# Patient Record
Sex: Female | Born: 1975 | Race: White | Hispanic: No | Marital: Single | Smoking: Current some day smoker
Health system: Southern US, Community
[De-identification: ages and names within clinical notes are randomized; demographics above are authoritative.]

## PROBLEM LIST (undated history)

## (undated) DIAGNOSIS — C539 Malignant neoplasm of cervix uteri, unspecified: Secondary | ICD-10-CM

## (undated) DIAGNOSIS — R609 Edema, unspecified: Secondary | ICD-10-CM

## (undated) DIAGNOSIS — M069 Rheumatoid arthritis, unspecified: Secondary | ICD-10-CM

## (undated) DIAGNOSIS — K5090 Crohn's disease, unspecified, without complications: Secondary | ICD-10-CM

## (undated) DIAGNOSIS — F4310 Post-traumatic stress disorder, unspecified: Secondary | ICD-10-CM

## (undated) DIAGNOSIS — G35D Multiple sclerosis, unspecified: Secondary | ICD-10-CM

## (undated) DIAGNOSIS — K529 Noninfective gastroenteritis and colitis, unspecified: Secondary | ICD-10-CM

## (undated) DIAGNOSIS — D649 Anemia, unspecified: Secondary | ICD-10-CM

## (undated) DIAGNOSIS — J9819 Other pulmonary collapse: Secondary | ICD-10-CM

## (undated) DIAGNOSIS — M1990 Unspecified osteoarthritis, unspecified site: Secondary | ICD-10-CM

## (undated) DIAGNOSIS — G35 Multiple sclerosis: Secondary | ICD-10-CM

## (undated) HISTORY — PX: ABDOMINAL SURGERY: SHX537

## (undated) HISTORY — DX: Multiple sclerosis: G35

## (undated) HISTORY — DX: Post-traumatic stress disorder, unspecified: F43.10

## (undated) HISTORY — DX: Multiple sclerosis, unspecified: G35.D

## (undated) HISTORY — PX: TUBAL LIGATION: SHX77

---

## 1997-12-19 ENCOUNTER — Inpatient Hospital Stay (HOSPITAL_COMMUNITY): Admission: EM | Admit: 1997-12-19 | Discharge: 1997-12-19 | Payer: Self-pay | Admitting: Obstetrics & Gynecology

## 1997-12-29 ENCOUNTER — Other Ambulatory Visit: Admission: RE | Admit: 1997-12-29 | Discharge: 1997-12-29 | Payer: Self-pay | Admitting: Obstetrics

## 1998-02-21 ENCOUNTER — Inpatient Hospital Stay (HOSPITAL_COMMUNITY): Admission: AD | Admit: 1998-02-21 | Discharge: 1998-02-21 | Payer: Self-pay | Admitting: Obstetrics

## 1998-03-29 ENCOUNTER — Ambulatory Visit (HOSPITAL_COMMUNITY): Admission: RE | Admit: 1998-03-29 | Discharge: 1998-03-29 | Payer: Self-pay | Admitting: Obstetrics

## 1998-04-25 ENCOUNTER — Inpatient Hospital Stay (HOSPITAL_COMMUNITY): Admission: AD | Admit: 1998-04-25 | Discharge: 1998-04-25 | Payer: Self-pay | Admitting: Obstetrics

## 1998-05-10 ENCOUNTER — Ambulatory Visit (HOSPITAL_COMMUNITY): Admission: RE | Admit: 1998-05-10 | Discharge: 1998-05-10 | Payer: Self-pay | Admitting: Obstetrics

## 1998-05-11 ENCOUNTER — Inpatient Hospital Stay (HOSPITAL_COMMUNITY): Admission: AD | Admit: 1998-05-11 | Discharge: 1998-05-11 | Payer: Self-pay | Admitting: Obstetrics

## 1998-06-17 ENCOUNTER — Inpatient Hospital Stay (HOSPITAL_COMMUNITY): Admission: AD | Admit: 1998-06-17 | Discharge: 1998-06-17 | Payer: Self-pay | Admitting: Obstetrics

## 1998-07-19 ENCOUNTER — Other Ambulatory Visit: Admission: RE | Admit: 1998-07-19 | Discharge: 1998-07-19 | Payer: Self-pay | Admitting: Obstetrics

## 1998-08-08 ENCOUNTER — Inpatient Hospital Stay (HOSPITAL_COMMUNITY): Admission: AD | Admit: 1998-08-08 | Discharge: 1998-08-08 | Payer: Self-pay | Admitting: Obstetrics

## 1998-08-22 ENCOUNTER — Observation Stay (HOSPITAL_COMMUNITY): Admission: AD | Admit: 1998-08-22 | Discharge: 1998-08-22 | Payer: Self-pay | Admitting: Obstetrics

## 1998-08-22 ENCOUNTER — Inpatient Hospital Stay (HOSPITAL_COMMUNITY): Admission: AD | Admit: 1998-08-22 | Discharge: 1998-08-24 | Payer: Self-pay | Admitting: Obstetrics

## 1999-01-25 ENCOUNTER — Other Ambulatory Visit: Admission: RE | Admit: 1999-01-25 | Discharge: 1999-01-25 | Payer: Self-pay | Admitting: Obstetrics

## 2000-10-28 ENCOUNTER — Emergency Department (HOSPITAL_COMMUNITY): Admission: EM | Admit: 2000-10-28 | Discharge: 2000-10-28 | Payer: Self-pay | Admitting: Internal Medicine

## 2001-10-28 ENCOUNTER — Inpatient Hospital Stay (HOSPITAL_COMMUNITY): Admission: AD | Admit: 2001-10-28 | Discharge: 2001-10-28 | Payer: Self-pay | Admitting: *Deleted

## 2002-02-04 ENCOUNTER — Encounter: Payer: Self-pay | Admitting: Emergency Medicine

## 2002-02-04 ENCOUNTER — Emergency Department (HOSPITAL_COMMUNITY): Admission: EM | Admit: 2002-02-04 | Discharge: 2002-02-04 | Payer: Self-pay | Admitting: Emergency Medicine

## 2003-11-14 ENCOUNTER — Emergency Department (HOSPITAL_COMMUNITY): Admission: EM | Admit: 2003-11-14 | Discharge: 2003-11-14 | Payer: Self-pay | Admitting: Emergency Medicine

## 2004-08-22 ENCOUNTER — Emergency Department (HOSPITAL_COMMUNITY): Admission: EM | Admit: 2004-08-22 | Discharge: 2004-08-22 | Payer: Self-pay | Admitting: Emergency Medicine

## 2004-09-11 ENCOUNTER — Ambulatory Visit: Payer: Self-pay | Admitting: Gastroenterology

## 2004-09-11 ENCOUNTER — Ambulatory Visit: Payer: Self-pay | Admitting: Infectious Diseases

## 2004-09-11 ENCOUNTER — Inpatient Hospital Stay (HOSPITAL_COMMUNITY): Admission: EM | Admit: 2004-09-11 | Discharge: 2004-09-15 | Payer: Self-pay | Admitting: Emergency Medicine

## 2004-09-13 ENCOUNTER — Encounter (INDEPENDENT_AMBULATORY_CARE_PROVIDER_SITE_OTHER): Payer: Self-pay | Admitting: Specialist

## 2004-11-10 ENCOUNTER — Ambulatory Visit: Payer: Self-pay | Admitting: Gastroenterology

## 2004-11-15 ENCOUNTER — Ambulatory Visit: Payer: Self-pay | Admitting: Gastroenterology

## 2004-12-29 ENCOUNTER — Encounter (INDEPENDENT_AMBULATORY_CARE_PROVIDER_SITE_OTHER): Payer: Self-pay | Admitting: *Deleted

## 2004-12-29 ENCOUNTER — Inpatient Hospital Stay (HOSPITAL_COMMUNITY): Admission: EM | Admit: 2004-12-29 | Discharge: 2005-01-02 | Payer: Self-pay | Admitting: Emergency Medicine

## 2004-12-31 ENCOUNTER — Ambulatory Visit: Payer: Self-pay | Admitting: Internal Medicine

## 2005-07-15 ENCOUNTER — Emergency Department (HOSPITAL_COMMUNITY): Admission: EM | Admit: 2005-07-15 | Discharge: 2005-07-15 | Payer: Self-pay | Admitting: Emergency Medicine

## 2005-09-19 ENCOUNTER — Emergency Department (HOSPITAL_COMMUNITY): Admission: EM | Admit: 2005-09-19 | Discharge: 2005-09-19 | Payer: Self-pay | Admitting: Emergency Medicine

## 2005-12-03 ENCOUNTER — Emergency Department (HOSPITAL_COMMUNITY): Admission: EM | Admit: 2005-12-03 | Discharge: 2005-12-03 | Payer: Self-pay | Admitting: Emergency Medicine

## 2005-12-07 ENCOUNTER — Ambulatory Visit: Payer: Self-pay | Admitting: Gastroenterology

## 2005-12-11 ENCOUNTER — Ambulatory Visit (HOSPITAL_COMMUNITY): Admission: RE | Admit: 2005-12-11 | Discharge: 2005-12-11 | Payer: Self-pay | Admitting: Obstetrics & Gynecology

## 2005-12-12 ENCOUNTER — Ambulatory Visit: Payer: Self-pay | Admitting: Gastroenterology

## 2006-01-30 ENCOUNTER — Emergency Department (HOSPITAL_COMMUNITY): Admission: EM | Admit: 2006-01-30 | Discharge: 2006-01-30 | Payer: Self-pay | Admitting: Emergency Medicine

## 2006-07-16 ENCOUNTER — Emergency Department (HOSPITAL_COMMUNITY): Admission: EM | Admit: 2006-07-16 | Discharge: 2006-07-16 | Payer: Self-pay | Admitting: Emergency Medicine

## 2007-08-13 ENCOUNTER — Emergency Department (HOSPITAL_COMMUNITY): Admission: EM | Admit: 2007-08-13 | Discharge: 2007-08-13 | Payer: Self-pay | Admitting: Emergency Medicine

## 2011-01-06 NOTE — Discharge Summary (Signed)
[name], Gloria Ramos                 ACCOUNT NO.:  [id number]   MEDICAL RECORD NO.:  [id number]           PATIENT TYPE:   LOCATION:  5730                         FACILITY:  [facility]   PHYSICIAN:  Wilhemina Bonito. Marina Goodell, M.D. [facility] OF BIRTH:  04/20/1976   DATE OF ADMISSION:  12/29/2004  DATE OF DISCHARGE:  01/02/2005                                 DISCHARGE SUMMARY   ADDENDUM   The surgical biopsy from the colon has returned and this reveals no active  colitis or dysplasia on random biopsies.      SG/MEDQ  D:  01/02/2005  T:  01/02/2005  Job:  [id number]   cc:   Barbette Hair. Arlyce Dice, M.D. [facility]

## 2011-01-06 NOTE — Discharge Summary (Signed)
[name], [name]                 ACCOUNT NO.:  [id number]   MEDICAL RECORD NO.:  [id number]          PATIENT TYPE:  INP   LOCATION:  5738                         FACILITY:  [facility]   PHYSICIAN:  Donald Pore, MD       DATE OF BIRTH:  Apr 18, 1976   DATE OF ADMISSION:  09/11/2004  DATE OF DISCHARGE:  09/15/2004                                 DISCHARGE SUMMARY   DISCHARGE DIAGNOSES:  1.  Bloody diarrhea, consistent with probable infectious colitis.  2.  Headache with history of migraine.  3.  Recurrent urinary tract infections.   DISCHARGE MEDICATIONS:  Metronidazole 500 mg by mouth 4 times daily x7 days.   PROCEDURE PERFORMED:  Flexible sigmoidoscopy done September 13, 2004.  Findings:  Mild left-sided inflammation changes secondary to possible  infectious colitis, post-infectious colitis, or idiopathic colitis.   CONSULTATIONS:  [name] was evaluated by Dr. Arlyce Dice of [facility], who  performed a flexible sigmoidoscopy.   HISTORY AND PHYSICAL:  Ms. How is a 35 year old previously healthy female  who presented with an approximately four-day history of diarrhea every 30  minutes.  Initial quality of the stool at the beginning of the illness was  watery and greenish; however, over the last day or so, she began to notice  bloody streaks and developed worsening abdominal pain, nausea and vomiting,  and anorexia.  She noted positive sick contacts, including her children and  her husband, who have all have a similar illness within the past week or  two.  The symptoms of her family members resolved within 24-48 hours, and  there was no bloody diarrhea noted in any of them.  [name] denies fever,  chest pain, shortness of breath, syncope upon presentation.  She did  complain of pain in her left lower quadrant.   PHYSICAL EXAMINATION:  VITAL SIGNS:  Temperature 98.3, pulse 118, blood  pressure 140/93, respirations 24.  O2 saturation 97% on room air.  GENERAL:  Patient was in moderate  distress in the fetal position lying on  the gurney with a vomit basin in her hand.  HEENT:  Extraocular movements are intact.  Pupils are equal, round and  reactive to light.  Oropharynx without lesions.  Tongue ring present.  Dry  mucous membranes.  NECK:  No lymphadenopathy.  RESPIRATORY:  Clear to auscultation bilaterally.  CARDIOVASCULAR:  Tachycardic with a regular rhythm.  Distal pulses 1+ and  equal bilaterally.  GI:  Soft.  Tender to palpation diffusely, worse in the left lower quadrant.  No rebound or guarding noted.  No masses appreciated.  Hyperactive bowel  sounds.  EXTREMITIES:  No edema of the lower extremities bilaterally.  Weak distal  pulses.  GU:  Deferred, per patient's request.  SKIN:  Warm and dry.  MUSCULOSKELETAL:  Moves all four extremities.  NEUROLOGIC:  Cranial nerves II-XII are intact bilaterally.  PSYCHIATRIC:  Agitated affect.   LABORATORY STUDIES:  Sodium 137, potassium 3.6, chloride 103, bicarbonate  29, BUN 15, creatinine 1, glucose 97.  Urine pregnancy test negative.  Bilirubin 0.5, alkaline phosphatase 72, AST 19,  ALT 13, protein 6.8, albumin  3.7, calcium 8.6, lipase 21.  White blood cell count 15.2, hemoglobin 13.7,  hematocrit 40.6, platelets 189, absolute neutrophil count 12.3, MCV 86.7.  Urinalysis is positive for protein 100 mg/dl, negative nitrites, negative  leukocyte esterase, pH greater than 9.   HOSPITAL COURSE:  1.  Bloody diarrhea:  Ms. Swarm presented with an approximately four-day      history of bloody diarrhea and abdominal pain as well as numerous sick      contacts with recent diarrheal illness.  Infectious diarrhea was high on      our differential diagnosis.  We sent stool cultures, C. difficile toxin,      fecal white blood cells, fecal occult blood test, as well as ova and      parasite testing of stool.  Ms. Tvedt was volume-contracted upon      presentation and was aggressively hydrated in the ED with approximately      5  liters of normal saline.  She was made n.p.o. and given p.r.n.      Phenergan for her nausea as well as morphine IV for her abdominal pain,      which are low suspicion for presentation of being an acute abdomen.  She      was started on IV ciprofloxacin and metronidazole for coverage of a      possible bacterial diarrhea.  The patient continued to have bloody      diarrhea over the first two days of her hospitalization, and GI was      consulted for further evaluation.  She underwent a flexible      sigmoidoscopy, which showed inflammatory changes in her colon.  She was      started empirically on mesalamine for the possibility of this being a      first presentation of ulcerative colitis.  Pathology subsequently      revealed that this was most likely consistent with an infectious colitis      and not ulcerative colitis, and mesalamine was not continued upon      discharge.  Patient began to improve steadily, and by hospital day #4,      had noted decreased blood in her stool as well as more formed stools.      She had her diet advanced and was tolerating a full regular diet by the      day of discharge and was feeling much better and eager to go home.  She      will continue a 7-day course of metronidazole as an outpatient and      return for hospital followup in the internal medicine clinic on September 29, 2004 at 2 p.m.  Patient was counseled that if her symptoms worsen or      if she has increasing bloody diarrhea, again, when she leaves the      hospital, she should see immediately see a physician or return to the ED      to be evaluated further.   DISCHARGE LABORATORIES:  Sodium 139, potassium 3.8, chloride 110,  bicarbonate 26, BUN 6, creatinine 0.9, glucose 94, calcium 8.2.  White blood  cell count 7.1, hemoglobin 11.4, hematocrit 34, platelets 190.   DISPOSITION:  Patient will follow up in internal medicine clinic on September 29, 2004 at 2 p.m.      HP/MEDQ  D:   09/15/2004  T:  09/15/2004  Job:  [id number]

## 2011-01-06 NOTE — H&P (Signed)
Gloria Ramos, Gloria Ramos                 ACCOUNT NO.:  [id number]   MEDICAL RECORD NO.:  [id number]          PATIENT TYPE:  EMS   LOCATION:  MAJO                         FACILITY:  [facility]   PHYSICIAN:  Barbette Hair. Arlyce Dice, M.D. [facility] OF BIRTH:  May 27, 1976   DATE OF ADMISSION:  12/29/2004  DATE OF DISCHARGE:                                HISTORY & PHYSICAL   REASON FOR ADMISSION:  Acute diarrheal illness.   HISTORY OF PRESENT ILLNESS:  Mrs. Gloria Ramos is a 35 year old white female.  She  was admitted September 11, 2004 through September 15, 2004 for acute diarrheal  illness with bloody stool.  She had a colonoscopy by Dr. Arlyce Dice at that  time.  He did not intubate the cecum, but within the colon she had a  nonspecific colitis pattern with some aphthous ulcers involving both the  left and right colon, as well as the rectum.  Biopsies returned showing mild  focal colitis consistent with either resolving infectious colitis versus  Crohn's disease versus nonsteroidal anti-inflammatory damage or scope  trauma.  Multiple stool studies were negative for infectious agents.  She  was initially treated with IV Cipro and Flagyl, but when she went home she  completed a 7-day course of oral Flagyl.  She had been treated with some  Asacol while hospitalized, but this was not continued as an outpatient.  I  am not clear whether this was intentional or an oversight on the part of  providers.  The patient states that her diarrheal symptoms resolved within  about 2 weeks.  At the time she had been sick, family members including  children and her fiancee had had similar illnesses, but they had not  persisted as hers had.  The patient did not keep her office follow up  appointment with Dr. Arlyce Dice.   The patient now presents to the ER (note that she does not have a primary  care physician) with a 3-day history of recurrent watery diarrheal stools,  which are not grossly bloody, nor melanic at the present time.  She is  feeling pretty worn out.  She has had some nausea and vomiting today.  She  does describe some chills and some sweats, but fever was not documented here  in the emergency room.   ALLERGIES:  HYDROCODONE.   CURRENT MEDICATIONS:  P.R.N. medications include -  1.  Ibuprofen for the last couple of days, of which she has used maybe 8 a      day.  2.  She took 4 aspirin on Tuesday.  3.  She has also been using some Tylenol p.r.n.  4.  Pepto-Bismol p.r.n.   PAST MEDICAL HISTORY:  1.  Tubal ligation in 2000.  Gravida 5, para 3 status.  2.  Status post exploratory laparotomy secondary to trauma at age 67.  3.  History of urinary tract infections.  4.  Migraine headache.  5.  Acute diarrheal illness in January of 2006.   SOCIAL HISTORY:  The patient lives with her fiancee, her 3 children, and his  1 child.  The ages of the  children are 6, 9, 9, and 12.  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  This is a  new job.  She quit smoking tobacco in January of 2006.  She denies illicit  drugs, and does not drink alcoholic beverages.  Her home does receive well  water and not city water.  Again, there are no sick contacts at home.  No  recent GI illnesses comparable to herself.   FAMILY HISTORY:  There is no family history of GI disease that she knows of.   REVIEW OF SYSTEMS:  As above.  She also describes some increased thirst and  increased urinary frequency.  She says that she washes her hands a lot at  work.   LABORATORY DATA:  Hemoglobin is 14.2, hematocrit 14.1, white blood cell  count 14.1, and platelets are 229,000.  MCV is 85.  On the differential, she  does have increased segmented neutrophils and decreased lymphocytes.  The  eosinophil count is within normal limits.  Sodium is 138, potassium 3.9,  chloride 106, BUN is 16, creatinine 1.2, and glucose 99.  Urinalysis shows  increase of 15 mg of ketones.  No nitrites and no leukocyte esterase is   present.  The urinary HCG for pregnancy is negative.   PHYSICAL EXAMINATION:  VITAL SIGNS:  Temperature 97, blood pressure 121/80,  pulse 104, respirations 22.  There was 99% saturation on room air.  GENERAL:  The patient is alert, a bit pale.  She is in some mild distress.  HEENT:  Sclerae are nonicteric.  Conjunctivae is pink.  Extraocular  movements are intact.  Oropharynx - mucosa is clear and moist.  NECK:  There is no JVD, masses, or goiter.  CHEST:  Clear to auscultation and percussion bilaterally.  No cough.  No  shortness of breath.  COR:  There is slight tachycardia with regular rhythm.  No murmurs, rubs, or  gallops appreciated.  ABDOMEN:  When distracted, it is nontender, but when questioned as examined,  she verbalizes some tenderness in the right mid to lower abdomen.  This is  not significant.  There is no guarding or rebound.  There is no  hepatosplenomegaly or masses.  Bowel sounds are active.  RECTAL:  Deferred.  EXTREMITIES:  No clubbing, cyanosis, or edema.  GU:  Deferred.  PSYCHIATRIC:  The patient is appropriate.   IMPRESSION:  Recurrent diarrheal illness.  Currently, she is not having any  gross blood as she was having during illness in January.  Rule out  infectious colitis, rule out inflammatory bowel disease.   PLAN:  The patient admitted to an observation bed for IV fluids.  Will begin  with oral Cipro and Flagyl.  Analgesic with Darvocet.  Plan a flexible  sigmoidoscopy for tomorrow morning.  If she indeed has active colitis, and  it is not clear from the mucosal pattern what type this is, it might be  worth obtaining the serum IBD first step testing in order to see whether  these fit into the category of some type of inflammatory bowel disease.  In  the meantime, she is going to be on clear liquids, and hopefully get a  private room, as, if this infectious, we do not want it spreading to another  patient.     SG/MEDQ  D:  12/29/2004  T:  12/29/2004   Job:  [id number]

## 2011-01-06 NOTE — Discharge Summary (Signed)
NAME:  Gloria Ramos, Gloria Ramos                 ACCOUNT NO.:  [id number]   MEDICAL RECORD NO.:  [id number]          PATIENT TYPE:  INP   LOCATION:  5730                         FACILITY:  [facility]   PHYSICIAN:  Wilhemina Bonito. Marina Goodell, M.D. [facility] OF BIRTH:  1976-07-22   DATE OF ADMISSION:  12/29/2004  DATE OF DISCHARGE:  01/02/2005                                 DISCHARGE SUMMARY   ADMITTING DIAGNOSES:  1.  Recurrent diarrheal illness.  Rule out infectious colitis versus      inflammatory bowel disease.  2.  History of acute diarrheal illness with colitis on colonoscopy in late      January 2006, etiology of the colitis indeterminate, but question      secondary to infection versus inflammatory bowel disease specifically      Crohn's disease.  3.  Status post tubal ligation in 2000.  4.  G5 P3.  5.  Status post exploratory laparotomy following trauma at age 65.  6.  History of urinary tract infections.  7.  History of migraine headaches.   DISCHARGE DIAGNOSIS:  Recurrent colitis, again question whether this  represents an inflammatory bowel disease specifically Crohn's versus  infection.  She did test positive for Clostridium difficile toxin during  this admission.   CONSULTATIONS:  None.   PROCEDURES:  Flexible sigmoidoscopy by Dr. Melvia Heaps on Dec 29, 2004.  This was performed to 50 cm and showed mild inflammatory changes of the  visualized left colon.  The distal colon was more severely inflamed than the  proximal area.  He did not see any ulcers or erosions or any granularity,  bleeding or edema.  Grossly, the colon exam was consistent with nonspecific  colitis.  At the present time biopsies taken during the flexible  sigmoidoscopy are pending.   BRIEF HISTORY:  Gloria Ramos is a 35 year old white female.  She does not have  a primary care physician.  She had been admitted to the teaching service on  January 22 through the 26th of 2006 for an acute diarrheal illness.  She had  a colonoscopy by  Dr. Arlyce Dice at the time which showed a nonspecific mild to  moderate colitis aphthous ulcers were present and involved the right and  left colon.  There was rectal sparing on that colonoscopy.  Cecum was not  visualized.  Biopsies showed mild focal colitis consistent with either  resolving infectious colitis versus Crohn's disease versus nonsteroidal anti-  inflammatory damage or scope trauma.  Multiple stool studies were negative  for infectious agents.  She initially was treated with IV ciprofloxacin and  Flagyl, but after colonoscopy was started on Asacol.  The Asacol was not  continued when she left the hospital probably due to an oversight, but she  did complete a seven day course of Flagyl.  She says that within two weeks,  the diarrhea had resolved and the abdominal pain had resolved.  She was  doing well up until about three or four days prior to this current admission  when she had recurrence of frequent watery stools and abdominal pain.  Note  that when she had her previous symptoms, there were family members who had  briefer and milder episodes of diarrheal illness.  For this admission, she  had no family contacts who had GI illnesses.  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  She did not keep her office  followup appointment with Dr. Arlyce Dice.  The patient describes some nausea and  vomiting on the day of coming to the emergency room along with some chills  and sweats, but she had no documented fever and at no time did she see any  blood or melena either in the stool or in her vomitus.  Symptoms had been  refractory to use of bismuth, Tylenol, aspirin and ibuprofen.  She does say  that although she started taking aspirin and ibuprofen when the symptoms  erupted, she had not been taking these medications prior to the recurrence  of GI symptoms.  She presented to the emergency room  and was admitted for  further management by Dr. Arlyce Dice with a strong suspicion that she had  underlying Crohn's or ulcerative colitis.   LABORATORIES:  White blood cell count initially 14.1, corrected to 6.1.  Hemoglobin initially 14.2 was 11.2 following hydration.  MCV 84.5.  Platelet  count 175,000.  Differential did reveal a left shift with 88% neutrophils  and diminished lymphocytes of 7%.  Sodium 139, potassium 3.9, glucose 99,  BUN 16, creatinine 1.2, and chloride 106.  Urine pregnancy was negative.  Urinalysis, as well as urine culture was negative.  Stool studies included  fecal leukocyte, routine pathogen culture, as well as ova and parasite exams  all negative.  The Clostridium difficile toxin study, however, was positive.  The IBD First Step test was obtained, but results are pending at this time.   RADIOLOGY:  Initial CT scan of the abdomen on the day she arrived at the  emergency room showed moderate bowel wall thickening, most prominent in the  cecum consistent with infectious or inflammatory colitis.  They are compared  with the January CT, there was some slightly progression in thickening of  the cecal wall.  Also seen were incidental small left ovarian cysts.  A  follow up CT within 24 hours showed interval resolution of moderate bowel  wall thickening in the cecum and slight increase to the size of the left  ovarian cyst.   HOSPITAL COURSE:  The patient was admitted to [facility] private room.  Various stool studies were ordered.  She was started on clear liquids and  generously hydrated with IV fluids.  Over the course of her hospitalization,  the pain was slow to resolve and she still had some lingering left-sided  pain, left-sided tenderness on exam, as well as fairly frequent use of both  oral and IV pain medications.  However, the diarrhea calmed down within a  couple of days to the point where by the 14th, she had not had any stools for 12 hours and on the 14th  itself she had a couple of formed stools.   The patient was empirically started on several medications because it was  not clear whether or not this was inflammatory bowel disease and/or an  infectious disease.  Initial medications included the oral Asacol, as well  as enemas of Rowasa and cortisol.  Coverage for possible infectious colitis  was started with oral ciprofloxacin.   One day following  her flexible sigmoidoscopy, the feeling was strong that  this was probably an inflammatory condition and Solu-Medrol was ordered.  However, that evening, the Clostridium difficile toxin came back positive  and medications were adjusted with the cessation of ciprofloxacin, the  addition of oral Flagyl and the discontinuation of Solu-Medrol.  She did not  receive more than one dose of the Solu-Medrol.  The patient's diet was  slowly advanced to full liquids, which she was tolerating.  By the morning  of 15th of May, she was quite eager to discharge to home.  She was in stable  and improved condition.   The patient has a followup appointment on May 26 with Dr. Arlyce Dice.  She was  advised that if she could not keep this appointment, that she should call  and if she misses the appointment that Dr. Arlyce Dice would not continue to see  her as a regular patient and that she would return to the unassigned GI  physician pool should she return to the hospital.  She received  prescriptions for all the new medications that she was started on and, in  fact, every medication she goes home with today represents a new medication  for this patient.  She was to follow a diet avoiding high fiber.  She was  okay to return to work on May 16 as she was not having significant diarrhea  at this time and was felt safe from a public health standpoint to return to  work.   DISCHARGE MEDICATIONS:  1.  Asacol 400 mg three p.o. t.i.d.  2.  Metronidazole 500 mg 1 p.o. t.i.d. for seven days with advisement to      avoid  alcoholic consumption while taking this medication.  3.  Tylox 5/325 mg 1 p.o. q.6h. p.r.n. severe pain.  A prescription count of      #20 was provided for patient.  4.  Hyoscyamine 0.375 mg 1 p.o. b.i.d.  5.  Mesalamine enema one every evening for one week and she was not to fill      this prescription unless she had recurrent significant diarrhea at which      point she should fill the prescription for the enemas and start to use      them.   Followup appointment May 26 at 1:30 with Dr. Arlyce Dice.  Also advised patient  to continue her habit of scrupulous hand-washing.      SG/MEDQ  D:  01/02/2005  T:  01/02/2005  Job:  [id number]   cc:   Barbette Hair. Arlyce Dice, M.D. [facility]

## 2011-05-26 LAB — URINE CULTURE: Colony Count: 35000

## 2011-05-26 LAB — URINE MICROSCOPIC-ADD ON

## 2011-05-26 LAB — URINALYSIS, ROUTINE W REFLEX MICROSCOPIC: Nitrite: POSITIVE — AB

## 2011-05-26 LAB — POCT PREGNANCY, URINE: Preg Test, Ur: NEGATIVE

## 2013-08-21 HISTORY — PX: CERVICAL CONE BIOPSY: SUR198

## 2013-10-12 ENCOUNTER — Emergency Department (HOSPITAL_COMMUNITY)
Admission: EM | Admit: 2013-10-12 | Discharge: 2013-10-13 | Disposition: A | Discharge status: Home / Self Care | Payer: Medicaid Other | Attending: Emergency Medicine | Admitting: Emergency Medicine

## 2013-10-12 ENCOUNTER — Emergency Department (HOSPITAL_COMMUNITY): Payer: Medicaid Other

## 2013-10-12 DIAGNOSIS — R079 Chest pain, unspecified: Secondary | ICD-10-CM

## 2013-10-12 DIAGNOSIS — Z8719 Personal history of other diseases of the digestive system: Secondary | ICD-10-CM | POA: Insufficient documentation

## 2013-10-12 DIAGNOSIS — R072 Precordial pain: Secondary | ICD-10-CM | POA: Insufficient documentation

## 2013-10-12 DIAGNOSIS — J40 Bronchitis, not specified as acute or chronic: Secondary | ICD-10-CM | POA: Insufficient documentation

## 2013-10-12 DIAGNOSIS — Z862 Personal history of diseases of the blood and blood-forming organs and certain disorders involving the immune mechanism: Secondary | ICD-10-CM | POA: Insufficient documentation

## 2013-10-12 DIAGNOSIS — F17200 Nicotine dependence, unspecified, uncomplicated: Secondary | ICD-10-CM | POA: Insufficient documentation

## 2013-10-12 DIAGNOSIS — Z8541 Personal history of malignant neoplasm of cervix uteri: Secondary | ICD-10-CM | POA: Insufficient documentation

## 2013-10-12 DIAGNOSIS — Z8739 Personal history of other diseases of the musculoskeletal system and connective tissue: Secondary | ICD-10-CM | POA: Insufficient documentation

## 2013-10-12 HISTORY — DX: Edema, unspecified: R60.9

## 2013-10-12 HISTORY — DX: Noninfective gastroenteritis and colitis, unspecified: K52.9

## 2013-10-12 HISTORY — DX: Unspecified osteoarthritis, unspecified site: M19.90

## 2013-10-12 HISTORY — DX: Anemia, unspecified: D64.9

## 2013-10-12 HISTORY — DX: Crohn's disease, unspecified, without complications: K50.90

## 2013-10-12 HISTORY — DX: Other pulmonary collapse: J98.19

## 2013-10-12 HISTORY — DX: Rheumatoid arthritis, unspecified: M06.9

## 2013-10-12 HISTORY — DX: Malignant neoplasm of cervix uteri, unspecified: C53.9

## 2013-10-12 MED ORDER — METHYLPREDNISOLONE SODIUM SUCC 125 MG IJ SOLR
125.0000 mg | Freq: Once | INTRAMUSCULAR | Status: AC
  Administered 2013-10-13: 125 mg via INTRAVENOUS
  Filled 2013-10-12: qty 2

## 2013-10-12 MED ORDER — IPRATROPIUM BROMIDE 0.02 % IN SOLN: 0.5000 mg | Freq: Once | RESPIRATORY_TRACT | Status: DC

## 2013-10-12 MED ORDER — FENTANYL CITRATE 0.05 MG/ML IJ SOLN
50.0000 ug | INTRAMUSCULAR | Status: DC | PRN
  Administered 2013-10-13: 50 ug via INTRAVENOUS
  Filled 2013-10-12: qty 2

## 2013-10-12 MED ORDER — IPRATROPIUM-ALBUTEROL 0.5-2.5 (3) MG/3ML IN SOLN
3.0000 mL | Freq: Once | RESPIRATORY_TRACT | Status: AC
  Administered 2013-10-12: 3 mL via RESPIRATORY_TRACT
  Filled 2013-10-12: qty 3

## 2013-10-12 MED ORDER — ALBUTEROL SULFATE (2.5 MG/3ML) 0.083% IN NEBU: 5.0000 mg | INHALATION_SOLUTION | Freq: Once | RESPIRATORY_TRACT | Status: DC

## 2013-10-12 MED ORDER — ALBUTEROL SULFATE (2.5 MG/3ML) 0.083% IN NEBU
2.5000 mg | INHALATION_SOLUTION | Freq: Once | RESPIRATORY_TRACT | Status: AC
Start: 2013-10-12 — End: 2013-10-12
  Administered 2013-10-12: 2.5 mg via RESPIRATORY_TRACT
  Filled 2013-10-12: qty 3

## 2013-10-12 MED ORDER — ONDANSETRON HCL 4 MG/2ML IJ SOLN
4.0000 mg | Freq: Once | INTRAMUSCULAR | Status: AC
  Administered 2013-10-13: 4 mg via INTRAVENOUS
  Filled 2013-10-12: qty 2

## 2013-10-12 NOTE — ED Notes (Signed)
Patient complaining of shortness of breath and chest pain that started tonight.

## 2013-10-12 NOTE — ED Provider Notes (Signed)
CSN: [id number]     Arrival date & time 10/12/13  2330 History  This chart was scribed for Teressa Lower, MD by Jenne Campus, ED Scribe. This patient was seen in room APA09/APA09 and the patient's care was started at 11:43 PM.   Chief Complaint  Patient presents with  . Shortness of Breath  . Chest Pain     Patient is a 38 y.o. female presenting with chest pain. The history is provided by the patient. No language interpreter was used.  Chest Pain Pain location:  Substernal area Pain radiates to:  Does not radiate Pain severity:  Moderate Onset quality:  Gradual Duration:  2 weeks Chronicity:  New Associated symptoms: cough     HPI Comments: Gloria Ramos is a 38 y.o. female who presents to the Emergency Department complaining of CP that has been present for the past couple of weeks with an associated NP cough. She denies any sore throat or rashes. She denies any sick contacts. She reports chronic BLE swelling that is stable and denies any changes. She denies having any chronic medical conditions including asthma. She is an occasional smoker.   Past Medical History  Diagnosis Date  . Anemia   . Arthritis   . Rheumatoid arthritis   . Crohn's disease   . Colitis   . Collapsed lung   . Cervical cancer   . Edema    Past Surgical History  Procedure Laterality Date  . Abdominal surgery     History reviewed. No pertinent family history. History  Substance Use Topics  . Smoking status: Current Every Day Smoker  . Smokeless tobacco: Not on file  . Alcohol Use: Yes     Comment: rare   No OB history provided.  Review of Systems  HENT: Negative for sore throat.   Respiratory: Positive for cough.   Cardiovascular: Positive for chest pain and leg swelling (chronic, no changes).  Skin: Negative for rash.  All other systems reviewed and are negative.      Allergies  Codeine; Hydrocodone; and Oxycodone  Home Medications  No current outpatient prescriptions on  file.  Triage Vitals: BP 143/69  Pulse 88  Temp(Src) 98 F (36.7 C) (Oral)  Resp 20  SpO2 98%  LMP 10/10/2013  Physical Exam  Nursing note and vitals reviewed. Constitutional: She is oriented to person, place, and time. She appears well-developed and well-nourished. No distress.  HENT:  Head: Normocephalic and atraumatic.  Mouth/Throat: Oropharynx is clear and moist.  Eyes: EOM are normal. No scleral icterus.  Neck: Neck supple. No tracheal deviation present.  Cardiovascular: Normal rate and regular rhythm.   Pulmonary/Chest: Effort normal. No respiratory distress. She has no wheezes. She has no rales.  Lungs sounds are decreased bilaterally with prolonged expiratory. No wheezes appreciated. No rales.  Abdominal: Soft. There is no tenderness.  Musculoskeletal: Normal range of motion. She exhibits edema.  Trace pretibial edema bilaterally without calf tenderness   Neurological: She is alert and oriented to person, place, and time.  Skin: Skin is warm and dry.  Psychiatric: She has a normal mood and affect. Her behavior is normal.    ED Course  Procedures (including critical care time)  Medications  fentaNYL (SUBLIMAZE) injection 50 mcg (50 mcg Intravenous Given 10/13/13 0036)  albuterol (PROVENTIL) (2.5 MG/3ML) 0.083% nebulizer solution 2.5 mg (2.5 mg Nebulization Given 10/12/13 2349)  ipratropium-albuterol (DUONEB) 0.5-2.5 (3) MG/3ML nebulizer solution 3 mL (3 mLs Nebulization Given 10/12/13 2349)  ondansetron (ZOFRAN) injection 4 mg (  4 mg Intravenous Given 10/13/13 0036)  methylPREDNISolone sodium succinate (SOLU-MEDROL) 125 mg/2 mL injection 125 mg (125 mg Intravenous Given 10/13/13 0036)    DIAGNOSTIC STUDIES: Oxygen Saturation is 98% on RA, normal by my interpretation.    COORDINATION OF CARE: 11:46 PM-Discussed treatment plan which includes medications, CXR, CBC panel, CMP and troponin with pt at bedside and pt agreed to plan.   Labs Review Labs Reviewed  CBC WITH  DIFFERENTIAL - Abnormal; Notable for the following:    RDW 16.0 (*)    All other components within normal limits  COMPREHENSIVE METABOLIC PANEL - Abnormal; Notable for the following:    Potassium 3.1 (*)    Glucose, Bld 110 (*)    Total Bilirubin 0.2 (*)    GFR calc non Af Amer 70 (*)    GFR calc Af Amer 81 (*)    All other components within normal limits  TROPONIN I   Imaging Review Dg Chest 2 View  10/13/2013   CLINICAL DATA:  Cough and shortness of breath. History of smoking and positive PPD.  EXAM: CHEST  2 VIEW  COMPARISON:  None.  FINDINGS: The lungs are well-aerated. There is elevation of the left hemidiaphragm. Minimal right basilar opacity may reflect atelectasis or possibly mild infection. There is no evidence of pleural effusion or pneumothorax.  The heart is normal in size; the mediastinal contour is within normal limits. No acute osseous abnormalities are seen.  IMPRESSION: Elevation of the right hemidiaphragm. Minimal right basilar airspace opacity may reflect atelectasis or possibly mild infection.   Electronically Signed   By: Garald Balding M.D.   On: 10/13/2013 01:57    EKG Interpretation    Date/Time:  Sunday October 12 2013 23:41:10 EST Ventricular Rate:  77 PR Interval:  118 QRS Duration: 90 QT Interval:  404 QTC Calculation: 457 R Axis:   65 Text Interpretation:  Normal sinus rhythm Normal ECG No previous ECGs available Confirmed by Teddie Curd  MD, Ziair Penson ([id number]) on 10/13/2013 12:05:18 AM           Albuterol breathing treatment provided. IV Solu-Medrol. Fentanyl, Zofran, Reglan.  On recheck pain improved, breathing improved. Lung sounds improved without any further wheezes.  Albuterol inhaler provided with plan discharge home, take prednisone use inhaler as needed. Patient agrees to strict return precautions. She will call the emergency department as needed, has plans to find a local primary care physician.  MDM   Final diagnoses:  Chest pain  Wheezy  bronchitis   Presents with hacking cough difficulty breathing and sharp substernal chest pain. Evaluated with chest x-ray labs obtained and reviewed as above. Improved with medications provided. vital signs and nurse's notes reviewed and considered   I personally performed the services described in this documentation, which was scribed in my presence. The recorded information has been reviewed and is accurate.       Teressa Lower, MD 10/13/13 [phone]

## 2013-10-13 ENCOUNTER — Emergency Department (HOSPITAL_COMMUNITY): Payer: Medicaid Other

## 2013-10-13 ENCOUNTER — Encounter (HOSPITAL_COMMUNITY): Payer: Self-pay | Admitting: Emergency Medicine

## 2013-10-13 LAB — COMPREHENSIVE METABOLIC PANEL
ALK PHOS: 77 U/L (ref 39–117)
ALT: 12 U/L (ref 0–35)
AST: 21 U/L (ref 0–37)
Albumin: 4 g/dL (ref 3.5–5.2)
BUN: 12 mg/dL (ref 6–23)
CO2: 27 meq/L (ref 19–32)
Calcium: 9.4 mg/dL (ref 8.4–10.5)
Chloride: 100 mEq/L (ref 96–112)
Creatinine, Ser: 1.01 mg/dL (ref 0.50–1.10)
GFR, EST AFRICAN AMERICAN: 81 mL/min — AB (ref >90)
GFR, EST NON AFRICAN AMERICAN: 70 mL/min — AB (ref >90)
GLUCOSE: 110 mg/dL — AB (ref 70–99)
Potassium: 3.1 mEq/L — ABNORMAL LOW (ref 3.7–5.3)
Sodium: 140 mEq/L (ref 137–147)
TOTAL PROTEIN: 7.8 g/dL (ref 6.0–8.3)
Total Bilirubin: 0.2 mg/dL — ABNORMAL LOW (ref 0.3–1.2)

## 2013-10-13 LAB — CBC WITH DIFFERENTIAL/PLATELET
Basophils Absolute: 0 10*3/uL (ref 0.0–0.1)
Basophils Relative: 0 % (ref 0–1)
EOS ABS: 0.2 10*3/uL (ref 0.0–0.7)
Eosinophils Relative: 2 % (ref 0–5)
HEMATOCRIT: 39.2 % (ref 36.0–46.0)
Hemoglobin: 13.1 g/dL (ref 12.0–15.0)
LYMPHS PCT: 39 % (ref 12–46)
Lymphs Abs: 2.7 10*3/uL (ref 0.7–4.0)
MCH: 27.7 pg (ref 26.0–34.0)
MCHC: 33.4 g/dL (ref 30.0–36.0)
MCV: 82.9 fL (ref 78.0–100.0)
MONO ABS: 0.7 10*3/uL (ref 0.1–1.0)
MONOS PCT: 10 % (ref 3–12)
NEUTROS ABS: 3.4 10*3/uL (ref 1.7–7.7)
NEUTROS PCT: 49 % (ref 43–77)
Platelets: 217 10*3/uL (ref 150–400)
RBC: 4.73 MIL/uL (ref 3.87–5.11)
RDW: 16 % — ABNORMAL HIGH (ref 11.5–15.5)
WBC: 7 10*3/uL (ref 4.0–10.5)

## 2013-10-13 LAB — TROPONIN I: Troponin I: <0.3 ng/mL (ref <0.30)

## 2013-10-13 MED ORDER — PREDNISONE 20 MG PO TABS: 60.0000 mg | ORAL_TABLET | Freq: Every day | ORAL | Status: DC

## 2013-10-13 MED ORDER — METOCLOPRAMIDE HCL 5 MG/ML IJ SOLN
INTRAMUSCULAR | Status: AC
  Filled 2013-10-13: qty 2

## 2013-10-13 MED ORDER — ALBUTEROL SULFATE (2.5 MG/3ML) 0.083% IN NEBU: 3.0000 mL | INHALATION_SOLUTION | RESPIRATORY_TRACT | Status: DC | PRN

## 2013-10-13 MED ORDER — METOCLOPRAMIDE HCL 5 MG/ML IJ SOLN
10.0000 mg | Freq: Once | INTRAMUSCULAR | Status: AC
  Administered 2013-10-13: 10 mg via INTRAVENOUS

## 2013-10-13 MED ORDER — POTASSIUM CHLORIDE CRYS ER 20 MEQ PO TBCR
40.0000 meq | EXTENDED_RELEASE_TABLET | Freq: Once | ORAL | Status: AC
  Administered 2013-10-13: 40 meq via ORAL
  Filled 2013-10-13: qty 2

## 2013-10-13 MED ORDER — ALBUTEROL SULFATE HFA 108 (90 BASE) MCG/ACT IN AERS
2.0000 | INHALATION_SPRAY | RESPIRATORY_TRACT | Status: DC | PRN
  Filled 2013-10-13: qty 6.7

## 2013-10-13 MED ORDER — BENZONATATE 100 MG PO CAPS: 100.0000 mg | ORAL_CAPSULE | Freq: Three times a day (TID) | ORAL | Status: DC

## 2013-10-13 MED ORDER — ALBUTEROL SULFATE HFA 108 (90 BASE) MCG/ACT IN AERS: 1.0000 | INHALATION_SPRAY | Freq: Four times a day (QID) | RESPIRATORY_TRACT | Status: DC | PRN

## 2013-10-13 NOTE — Discharge Instructions (Signed)
Asthma, Acute Bronchospasm °Acute bronchospasm caused by asthma is also referred to as an asthma attack. Bronchospasm means your air passages become narrowed. The narrowing is caused by inflammation and tightening of the muscles in the air tubes (bronchi) in your lungs. This can make it hard to breath or cause you to wheeze and cough. °CAUSES °Possible triggers are: °· Animal dander from the skin, hair, or feathers of animals. °· Dust mites contained in house dust. °· Cockroaches. °· Pollen from trees or grass. °· Mold. °· Cigarette or tobacco smoke. °· Air pollutants such as dust, household cleaners, hair sprays, aerosol sprays, paint fumes, strong chemicals, or strong odors. °· Cold air or weather changes. Cold air may trigger inflammation. Winds increase molds and pollens in the air. °· Strong emotions such as crying or laughing hard. °· Stress. °· Certain medicines such as aspirin or beta-blockers. °· Sulfites in foods and drinks, such as dried fruits and wine. °· Infections or inflammatory conditions, such as a flu, cold, or inflammation of the nasal membranes (rhinitis). °· Gastroesophageal reflux disease (GERD). GERD is a condition where stomach acid backs up into your throat (esophagus). °· Exercise or strenuous activity. °SIGNS AND SYMPTOMS  °· Wheezing. °· Excessive coughing, particularly at night. °· Chest tightness. °· Shortness of breath. °DIAGNOSIS  °Your health care provider will ask you about your medical history and perform a physical exam. A chest X-ray or blood testing may be performed to look for other causes of your symptoms or other conditions that may have triggered your asthma attack.  °TREATMENT  °Treatment is aimed at reducing inflammation and opening up the airways in your lungs.  Most asthma attacks are treated with inhaled medicines. These include quick relief or rescue medicines (such as bronchodilators) and controller medicines (such as inhaled corticosteroids). These medicines are  sometimes given through an inhaler or a nebulizer. Systemic steroid medicine taken by mouth or given through an IV tube also can be used to reduce the inflammation when an attack is moderate or severe. Antibiotic medicines are only used if a bacterial infection is present.  °HOME CARE INSTRUCTIONS  °· Rest. °· Drink plenty of liquids. This helps the mucus to remain thin and be easily coughed up. Only use caffeine in moderation and do not use alcohol until you have recovered from your illness. °· Do not smoke. Avoid being exposed to secondhand smoke. °· You play a critical role in keeping yourself in good health. Avoid exposure to things that cause you to wheeze or to have breathing problems. °· Keep your medicines up to date and available. Carefully follow your health care provider's treatment plan. °· Take your medicine exactly as prescribed. °· When pollen or pollution is bad, keep windows closed and use an air conditioner or go to places with air conditioning. °· Asthma requires careful medical care. See your health care provider for a follow-up as advised. If you are more than [redacted] weeks pregnant and you were prescribed any new medicines, let your obstetrician know about the visit and how you are doing. Follow-up with your health care provider as directed. °· After you have recovered from your asthma attack, make an appointment with your outpatient doctor to talk about ways to reduce the likelihood of future attacks. If you do not have a doctor who manages your asthma, make an appointment with a primary care doctor to discuss your asthma. °SEEK IMMEDIATE MEDICAL CARE IF:  °· You are getting worse. °· You have trouble breathing. If severe, call   your local emergency services (911 in the [place] °· You develop chest pain or discomfort. °· You are vomiting. °· You are not able to keep fluids down. °· You are coughing up yellow, green, brown, or bloody sputum. °· You have a fever and your symptoms suddenly get  worse. °· You have trouble swallowing. °MAKE SURE YOU:  °· Understand these instructions. °· Will watch your condition. °· Will get help right away if you are not doing well or get worse. °Document Released: 11/22/2006 Document Revised: 04/09/2013 Document Reviewed: 02/12/2013 °[employer]® Patient Information ©2014 [employer]. ° °

## 2014-06-05 ENCOUNTER — Emergency Department (HOSPITAL_COMMUNITY): Payer: Medicaid Other

## 2014-06-05 ENCOUNTER — Encounter (HOSPITAL_COMMUNITY): Payer: Self-pay | Admitting: Emergency Medicine

## 2014-06-05 ENCOUNTER — Emergency Department (HOSPITAL_COMMUNITY)
Admission: EM | Admit: 2014-06-05 | Discharge: 2014-06-05 | Disposition: A | Discharge status: Home / Self Care | Payer: Medicaid Other | Attending: Emergency Medicine | Admitting: Emergency Medicine

## 2014-06-05 DIAGNOSIS — R1030 Lower abdominal pain, unspecified: Secondary | ICD-10-CM | POA: Insufficient documentation

## 2014-06-05 DIAGNOSIS — Z8739 Personal history of other diseases of the musculoskeletal system and connective tissue: Secondary | ICD-10-CM | POA: Diagnosis not present

## 2014-06-05 DIAGNOSIS — R10811 Right upper quadrant abdominal tenderness: Secondary | ICD-10-CM | POA: Insufficient documentation

## 2014-06-05 DIAGNOSIS — Z792 Long term (current) use of antibiotics: Secondary | ICD-10-CM | POA: Diagnosis not present

## 2014-06-05 DIAGNOSIS — R109 Unspecified abdominal pain: Secondary | ICD-10-CM

## 2014-06-05 DIAGNOSIS — Z79899 Other long term (current) drug therapy: Secondary | ICD-10-CM | POA: Insufficient documentation

## 2014-06-05 DIAGNOSIS — R10813 Right lower quadrant abdominal tenderness: Secondary | ICD-10-CM | POA: Diagnosis not present

## 2014-06-05 DIAGNOSIS — Z8709 Personal history of other diseases of the respiratory system: Secondary | ICD-10-CM | POA: Diagnosis not present

## 2014-06-05 DIAGNOSIS — K625 Hemorrhage of anus and rectum: Secondary | ICD-10-CM | POA: Diagnosis not present

## 2014-06-05 DIAGNOSIS — Z8541 Personal history of malignant neoplasm of cervix uteri: Secondary | ICD-10-CM | POA: Insufficient documentation

## 2014-06-05 DIAGNOSIS — Z862 Personal history of diseases of the blood and blood-forming organs and certain disorders involving the immune mechanism: Secondary | ICD-10-CM | POA: Diagnosis not present

## 2014-06-05 DIAGNOSIS — Z720 Tobacco use: Secondary | ICD-10-CM | POA: Insufficient documentation

## 2014-06-05 LAB — COMPREHENSIVE METABOLIC PANEL
ALBUMIN: 3.8 g/dL (ref 3.5–5.2)
ALK PHOS: 69 U/L (ref 39–117)
ALT: 11 U/L (ref 0–35)
ANION GAP: 13 (ref 5–15)
AST: 17 U/L (ref 0–37)
BUN: 10 mg/dL (ref 6–23)
CO2: 26 mEq/L (ref 19–32)
CREATININE: 0.89 mg/dL (ref 0.50–1.10)
Calcium: 9.5 mg/dL (ref 8.4–10.5)
Chloride: 104 mEq/L (ref 96–112)
GFR, EST NON AFRICAN AMERICAN: 81 mL/min — AB (ref >90)
Glucose, Bld: 92 mg/dL (ref 70–99)
Potassium: 3.9 mEq/L (ref 3.7–5.3)
Sodium: 143 mEq/L (ref 137–147)
Total Bilirubin: <0.2 mg/dL — ABNORMAL LOW (ref 0.3–1.2)
Total Protein: 7.3 g/dL (ref 6.0–8.3)

## 2014-06-05 LAB — CBC WITH DIFFERENTIAL/PLATELET
BASOS PCT: 0 % (ref 0–1)
Basophils Absolute: 0 10*3/uL (ref 0.0–0.1)
Eosinophils Absolute: 0.2 10*3/uL (ref 0.0–0.7)
Eosinophils Relative: 2 % (ref 0–5)
HEMATOCRIT: 35.1 % — AB (ref 36.0–46.0)
Hemoglobin: 11.3 g/dL — ABNORMAL LOW (ref 12.0–15.0)
Lymphocytes Relative: 31 % (ref 12–46)
Lymphs Abs: 3.7 10*3/uL (ref 0.7–4.0)
MCH: 25.1 pg — ABNORMAL LOW (ref 26.0–34.0)
MCHC: 32.2 g/dL (ref 30.0–36.0)
MCV: 78 fL (ref 78.0–100.0)
MONO ABS: 0.8 10*3/uL (ref 0.1–1.0)
MONOS PCT: 7 % (ref 3–12)
NEUTROS ABS: 7.3 10*3/uL (ref 1.7–7.7)
NEUTROS PCT: 60 % (ref 43–77)
Platelets: 260 10*3/uL (ref 150–400)
RBC: 4.5 MIL/uL (ref 3.87–5.11)
RDW: 16 % — ABNORMAL HIGH (ref 11.5–15.5)
WBC: 12.1 10*3/uL — ABNORMAL HIGH (ref 4.0–10.5)

## 2014-06-05 LAB — URINALYSIS, ROUTINE W REFLEX MICROSCOPIC
BILIRUBIN URINE: NEGATIVE
GLUCOSE, UA: NEGATIVE mg/dL
HGB URINE DIPSTICK: NEGATIVE
Ketones, ur: NEGATIVE mg/dL
Leukocytes, UA: NEGATIVE
Nitrite: NEGATIVE
PROTEIN: NEGATIVE mg/dL
Specific Gravity, Urine: 1.018 (ref 1.005–1.030)
Urobilinogen, UA: 1 mg/dL (ref 0.0–1.0)
pH: 6.5 (ref 5.0–8.0)

## 2014-06-05 LAB — I-STAT BETA HCG BLOOD, ED (MC, WL, AP ONLY)

## 2014-06-05 LAB — LIPASE, BLOOD: Lipase: 50 U/L (ref 11–59)

## 2014-06-05 LAB — POC OCCULT BLOOD, ED: FECAL OCCULT BLD: NEGATIVE

## 2014-06-05 MED ORDER — IOHEXOL 300 MG/ML  SOLN
100.0000 mL | Freq: Once | INTRAMUSCULAR | Status: AC | PRN
  Administered 2014-06-05: 100 mL via INTRAVENOUS

## 2014-06-05 MED ORDER — IOHEXOL 300 MG/ML  SOLN
50.0000 mL | Freq: Once | INTRAMUSCULAR | Status: AC | PRN
  Administered 2014-06-05: 50 mL via ORAL

## 2014-06-05 MED ORDER — ONDANSETRON HCL 4 MG/2ML IJ SOLN
4.0000 mg | Freq: Once | INTRAMUSCULAR | Status: AC
  Administered 2014-06-05: 4 mg via INTRAVENOUS
  Filled 2014-06-05: qty 2

## 2014-06-05 MED ORDER — ONDANSETRON HCL 4 MG PO TABS: 4.0000 mg | ORAL_TABLET | Freq: Four times a day (QID) | ORAL | Status: DC

## 2014-06-05 MED ORDER — FENTANYL CITRATE 0.05 MG/ML IJ SOLN
50.0000 ug | Freq: Once | INTRAMUSCULAR | Status: AC
  Administered 2014-06-05: 50 ug via INTRAVENOUS
  Filled 2014-06-05: qty 2

## 2014-06-05 MED ORDER — SODIUM CHLORIDE 0.9 % IV SOLN
1000.0000 mL | INTRAVENOUS | Status: DC
  Administered 2014-06-05: 1000 mL via INTRAVENOUS

## 2014-06-05 MED ORDER — DICYCLOMINE HCL 20 MG PO TABS: 20.0000 mg | ORAL_TABLET | Freq: Two times a day (BID) | ORAL | Status: DC

## 2014-06-05 MED ORDER — SODIUM CHLORIDE 0.9 % IV SOLN
1000.0000 mL | Freq: Once | INTRAVENOUS | Status: AC
  Administered 2014-06-05: 1000 mL via INTRAVENOUS

## 2014-06-05 NOTE — ED Provider Notes (Signed)
CSN: [id number]     Arrival date & time 06/05/14  0111 History   First MD Initiated Contact with Patient 06/05/14 [phone]     Chief Complaint  Patient presents with  . Rectal Bleeding  . Abdominal Pain    HPI Pt has noticed sharp shooting pain in her lower abdomen on both sides.  It started a few days ago.  She has also noticed blood in her stool.  The pain at times is severe.  No diarrhea.  No vomiting.  She has had fevers up to 101.4  She has history of Crohns disease and IBS.  [place].  She has never taken any medications for Crohns disease.  She has not had symptoms this bad before.  No GI doctor in this area.   Past Medical History  Diagnosis Date  . Anemia   . Arthritis   . Rheumatoid arthritis   . Crohn's disease   . Colitis   . Collapsed lung   . Cervical cancer   . Edema    Past Surgical History  Procedure Laterality Date  . Abdominal surgery     History reviewed. No pertinent family history. History  Substance Use Topics  . Smoking status: Current Every Day Smoker  . Smokeless tobacco: Not on file  . Alcohol Use: Yes     Comment: rare   OB History   Grav Para Term Preterm Abortions TAB SAB Ect Mult Living                 Review of Systems  All other systems reviewed and are negative.     Allergies  Codeine; Food; Hydrocodone; and Oxycodone  Home Medications   Prior to Admission medications   Medication Sig Start Date End Date Taking? Authorizing Provider  albuterol (PROVENTIL HFA;VENTOLIN HFA) 108 (90 BASE) MCG/ACT inhaler Inhale 1-2 puffs into the lungs every 6 (six) hours as needed for wheezing or shortness of breath. 10/13/13  Yes Teressa Lower, MD  dicyclomine (BENTYL) 20 MG tablet Take 1 tablet (20 mg total) by mouth 2 (two) times daily. 06/05/14   Dorie Rank, MD  ondansetron (ZOFRAN) 4 MG tablet Take 1 tablet (4 mg total) by mouth every 6 (six) hours. 06/05/14   Dorie Rank, MD   BP 116/67  Pulse 88  Temp(Src) 98.1  F (36.7 C) (Oral)  Resp 16  SpO2 99%  LMP 05/24/2014 Physical Exam  Nursing note and vitals reviewed. Constitutional: She appears well-developed and well-nourished. No distress.  HENT:  Head: Normocephalic and atraumatic.  Right Ear: External ear normal.  Left Ear: External ear normal.  Eyes: Conjunctivae are normal. Right eye exhibits no discharge. Left eye exhibits no discharge. No scleral icterus.  Neck: Neck supple. No tracheal deviation present.  Cardiovascular: Normal rate, regular rhythm and intact distal pulses.   Pulmonary/Chest: Effort normal and breath sounds normal. No stridor. No respiratory distress. She has no wheezes. She has no rales.  Abdominal: Soft. Bowel sounds are normal. She exhibits no distension. There is tenderness in the right lower quadrant and left upper quadrant. There is no rebound and no guarding.  Musculoskeletal: She exhibits no edema and no tenderness.  Neurological: She is alert. She has normal strength. No cranial nerve deficit (no facial droop, extraocular movements intact, no slurred speech) or sensory deficit. She exhibits normal muscle tone. She displays no seizure activity. Coordination normal.  Skin: Skin is warm and dry. No rash noted.  Psychiatric:  She has a normal mood and affect.    ED Course  Procedures (including critical care time) Labs Review Labs Reviewed  CBC WITH DIFFERENTIAL - Abnormal; Notable for the following:    WBC 12.1 (*)    Hemoglobin 11.3 (*)    HCT 35.1 (*)    MCH 25.1 (*)    RDW 16.0 (*)    All other components within normal limits  COMPREHENSIVE METABOLIC PANEL - Abnormal; Notable for the following:    Total Bilirubin <0.2 (*)    GFR calc non Af Amer 81 (*)    All other components within normal limits  URINALYSIS, ROUTINE W REFLEX MICROSCOPIC - Abnormal; Notable for the following:    APPearance CLOUDY (*)    All other components within normal limits  LIPASE, BLOOD  POC OCCULT BLOOD, ED  I-STAT BETA HCG  BLOOD, ED ([facility], [facility], [facility] ONLY)    Imaging Review Ct Abdomen Pelvis W Contrast  06/05/2014   CLINICAL DATA:  Abdominal pain with rectal bleeding for 3 days. History of Crohn's disease and cervical cancer. Initial encounter.  EXAM: CT ABDOMEN AND PELVIS WITH CONTRAST  TECHNIQUE: Multidetector CT imaging of the abdomen and pelvis was performed using the standard protocol following bolus administration of intravenous contrast.  CONTRAST:  175mL OMNIPAQUE IOHEXOL 300 MG/ML  SOLN  COMPARISON:  12/30/2004.  FINDINGS: BODY WALL: Unremarkable.  LOWER CHEST: New elevation of the left diaphragm compared to 2006, but present on chest x-ray 10/13/2013. There is mild fat herniation through the esophageal hiatus. Mild atelectasis at the left base.  ABDOMEN/PELVIS:  Liver: 8 mm low attenuating focus within the right hepatic lobe that is nonspecific. Development since 2006 of limited diagnostic utility given long interval.  Biliary: No evidence of biliary obstruction or Ehmke.  Pancreas: Unremarkable.  Spleen: Unremarkable.  Adrenals: Unremarkable.  Kidneys and ureters: No hydronephrosis or Sav[place].  Bladder: Unremarkable.  Reproductive: Unremarkable. No infiltration around the cervix. No pelvic lymphadenopathy.  Bowel: No obstruction. Normal appendix.  Retroperitoneum: No mass or adenopathy.  Peritoneum: No ascites or pneumoperitoneum.  Vascular: No acute abnormality.  OSSEOUS: No acute abnormalities.  IMPRESSION: 1. No acute intra-abdominal findings. No evidence of active Crohn's disease. 2. 8 mm low-density lesion in the right liver which is new from 2006. In this patient with malignancy history, if no comparison imaging available it would be reasonable to obtain follow-up MRI or CT in 3-6 months. 3. Left diaphragm elevation which has occurred since 2006.   Electronically Signed   By: Jorje Guild M.D.   On: 06/05/2014 06:53    Medications  0.9 %  sodium chloride infusion (0 mLs Intravenous Stopped 06/05/14 0513)     Followed by  0.9 %  sodium chloride infusion (0 mLs Intravenous Stopped 06/05/14 0514)  ondansetron (ZOFRAN) injection 4 mg (4 mg Intravenous Given 06/05/14 0317)  fentaNYL (SUBLIMAZE) injection 50 mcg (50 mcg Intravenous Given 06/05/14 0317)  iohexol (OMNIPAQUE) 300 MG/ML solution 50 mL (50 mLs Oral Contrast Given 06/05/14 0509)  iohexol (OMNIPAQUE) 300 MG/ML solution 100 mL (100 mLs Intravenous Contrast Given 06/05/14 0622)     MDM   Final diagnoses:  Abdominal pain, unspecified abdominal location    CT scan unremarkable.  No signs of active crohn's disease.  Pt's history is unclear in that she has never been on any medications for Crohn's disease.  Recommend GI follow up to review her prior evaluation.  At this time there does not appear to be any evidence of an acute  emergency medical condition and the patient appears stable for discharge with appropriate outpatient follow up.     Dorie Rank, MD 06/05/14 [phone]

## 2014-06-05 NOTE — Discharge Instructions (Signed)

## 2014-06-05 NOTE — ED Notes (Signed)
Patient transported to CT 

## 2014-06-05 NOTE — ED Notes (Signed)
Pt arrived to the ED with a complaint of rectal bleeding following abdominal pain.  Pt states that she has a hx of cervical cancer, IBS and has been rectally bleeding bright red blood for three days.

## 2014-06-05 NOTE — ED Notes (Signed)
Brief rectal exam as ordered.  Noted hard stool upon exam-no bleeding noted/no blood on this writer's finger after exam.

## 2014-08-28 ENCOUNTER — Encounter (HOSPITAL_COMMUNITY): Payer: Self-pay

## 2014-08-28 ENCOUNTER — Emergency Department (HOSPITAL_COMMUNITY)
Admission: EM | Admit: 2014-08-28 | Discharge: 2014-08-28 | Disposition: A | Discharge status: Home / Self Care | Payer: Medicaid Other | Attending: Emergency Medicine | Admitting: Emergency Medicine

## 2014-08-28 DIAGNOSIS — M545 Low back pain: Secondary | ICD-10-CM | POA: Insufficient documentation

## 2014-08-28 DIAGNOSIS — N390 Urinary tract infection, site not specified: Secondary | ICD-10-CM | POA: Insufficient documentation

## 2014-08-28 DIAGNOSIS — K529 Noninfective gastroenteritis and colitis, unspecified: Secondary | ICD-10-CM | POA: Diagnosis not present

## 2014-08-28 DIAGNOSIS — Z8709 Personal history of other diseases of the respiratory system: Secondary | ICD-10-CM | POA: Insufficient documentation

## 2014-08-28 DIAGNOSIS — Z720 Tobacco use: Secondary | ICD-10-CM | POA: Insufficient documentation

## 2014-08-28 DIAGNOSIS — Z8739 Personal history of other diseases of the musculoskeletal system and connective tissue: Secondary | ICD-10-CM | POA: Diagnosis not present

## 2014-08-28 DIAGNOSIS — Z8541 Personal history of malignant neoplasm of cervix uteri: Secondary | ICD-10-CM | POA: Diagnosis not present

## 2014-08-28 DIAGNOSIS — Z862 Personal history of diseases of the blood and blood-forming organs and certain disorders involving the immune mechanism: Secondary | ICD-10-CM | POA: Diagnosis not present

## 2014-08-28 DIAGNOSIS — R300 Dysuria: Secondary | ICD-10-CM | POA: Diagnosis present

## 2014-08-28 DIAGNOSIS — N760 Acute vaginitis: Secondary | ICD-10-CM | POA: Diagnosis not present

## 2014-08-28 DIAGNOSIS — Z3202 Encounter for pregnancy test, result negative: Secondary | ICD-10-CM | POA: Insufficient documentation

## 2014-08-28 DIAGNOSIS — B9689 Other specified bacterial agents as the cause of diseases classified elsewhere: Secondary | ICD-10-CM

## 2014-08-28 LAB — URINALYSIS, ROUTINE W REFLEX MICROSCOPIC
BILIRUBIN URINE: NEGATIVE
GLUCOSE, UA: NEGATIVE mg/dL
Ketones, ur: NEGATIVE mg/dL
Nitrite: NEGATIVE
Protein, ur: NEGATIVE mg/dL
Specific Gravity, Urine: 1.015 (ref 1.005–1.030)
UROBILINOGEN UA: 0.2 mg/dL (ref 0.0–1.0)
pH: 6 (ref 5.0–8.0)

## 2014-08-28 LAB — WET PREP, GENITAL
Trich, Wet Prep: NONE SEEN
Yeast Wet Prep HPF POC: NONE SEEN

## 2014-08-28 LAB — URINE MICROSCOPIC-ADD ON

## 2014-08-28 LAB — POC URINE PREG, ED: Preg Test, Ur: NEGATIVE

## 2014-08-28 MED ORDER — METRONIDAZOLE 500 MG PO TABS: 500.0000 mg | ORAL_TABLET | Freq: Two times a day (BID) | ORAL | Status: DC

## 2014-08-28 MED ORDER — CEPHALEXIN 500 MG PO CAPS
500.0000 mg | ORAL_CAPSULE | Freq: Once | ORAL | Status: AC
  Administered 2014-08-28: 500 mg via ORAL
  Filled 2014-08-28: qty 1

## 2014-08-28 MED ORDER — CEPHALEXIN 500 MG PO CAPS: 500.0000 mg | ORAL_CAPSULE | Freq: Two times a day (BID) | ORAL | Status: AC

## 2014-08-28 MED ORDER — NAPROXEN 500 MG PO TABS: 500.0000 mg | ORAL_TABLET | Freq: Two times a day (BID) | ORAL | Status: DC

## 2014-08-28 MED ORDER — PHENAZOPYRIDINE HCL 200 MG PO TABS
200.0000 mg | ORAL_TABLET | Freq: Once | ORAL | Status: AC
  Administered 2014-08-28: 200 mg via ORAL
  Filled 2014-08-28: qty 1

## 2014-08-28 MED ORDER — METRONIDAZOLE 500 MG PO TABS
500.0000 mg | ORAL_TABLET | Freq: Once | ORAL | Status: AC
  Administered 2014-08-28: 500 mg via ORAL
  Filled 2014-08-28: qty 1

## 2014-08-28 MED ORDER — IBUPROFEN 200 MG PO TABS
600.0000 mg | ORAL_TABLET | Freq: Once | ORAL | Status: AC
  Administered 2014-08-28: 600 mg via ORAL
  Filled 2014-08-28: qty 3

## 2014-08-28 MED ORDER — PHENAZOPYRIDINE HCL 200 MG PO TABS: 200.0000 mg | ORAL_TABLET | Freq: Three times a day (TID) | ORAL | Status: DC | PRN

## 2014-08-28 NOTE — Discharge Instructions (Signed)

## 2014-08-28 NOTE — ED Notes (Signed)
Per pt, bladder pain x 2-3 days.  Abdominal pain. Urine with odor.  Burning.  Back pain. Fever x 2 days.  Hx of same.  No hx of kidney stones.

## 2014-08-28 NOTE — ED Provider Notes (Signed)
CSN: [id number]     Arrival date & time 08/28/14  6578 History   First MD Initiated Contact with Patient 08/28/14 [phone]     Chief Complaint  Patient presents with  . Dysuria     (Consider location/radiation/quality/duration/timing/severity/associated sxs/prior Treatment) HPI Comments: Patient presents with to 3 days of suprapubic pain, dysuria, frequency, odiferous urine and bilateral low back pain.  She has had subjective fevers and chills.  She's also had some dark brown/bloody vaginal discharge for 2-3 days as well.  She reports history of bladder infections and disposition history of PID, though denies kidney stones.   Patient is a 39 y.o. female presenting with dysuria.  Dysuria Associated symptoms: fever and vaginal discharge   Associated symptoms: no abdominal pain, no nausea and no vomiting     Past Medical History  Diagnosis Date  . Anemia   . Arthritis   . Rheumatoid arthritis   . Crohn's disease   . Colitis   . Collapsed lung   . Cervical cancer   . Edema    Past Surgical History  Procedure Laterality Date  . Abdominal surgery     History reviewed. No pertinent family history. History  Substance Use Topics  . Smoking status: Current Every Day Smoker  . Smokeless tobacco: Not on file  . Alcohol Use: Yes     Comment: rare   OB History    No data available     Review of Systems  Constitutional: Positive for fever and chills. Negative for diaphoresis, activity change, appetite change and fatigue.  HENT: Negative for congestion, facial swelling, rhinorrhea and sore throat.   Eyes: Negative for photophobia and discharge.  Respiratory: Negative for cough, chest tightness and shortness of breath.   Cardiovascular: Negative for chest pain, palpitations and leg swelling.  Gastrointestinal: Negative for nausea, vomiting, abdominal pain and diarrhea.  Endocrine: Negative for polydipsia and polyuria.  Genitourinary: Positive for dysuria, frequency and vaginal  discharge. Negative for difficulty urinating and pelvic pain.  Musculoskeletal: Negative for back pain, arthralgias, neck pain and neck stiffness.  Skin: Negative for color change and wound.  Allergic/Immunologic: Negative for immunocompromised state.  Neurological: Negative for facial asymmetry, weakness, numbness and headaches.  Hematological: Does not bruise/bleed easily.  Psychiatric/Behavioral: Negative for confusion and agitation.      Allergies  Codeine; Food; Hydrocodone; and Oxycodone  Home Medications   Prior to Admission medications   Medication Sig Start Date End Date Taking? Authorizing Provider  albuterol (PROVENTIL HFA;VENTOLIN HFA) 108 (90 BASE) MCG/ACT inhaler Inhale 1-2 puffs into the lungs every 6 (six) hours as needed for wheezing or shortness of breath. 10/13/13  Yes Teressa Lower, MD  cephALEXin (KEFLEX) 500 MG capsule Take 1 capsule (500 mg total) by mouth 2 (two) times daily. 08/28/14 09/03/14  Ernestina Patches, MD  dicyclomine (BENTYL) 20 MG tablet Take 1 tablet (20 mg total) by mouth 2 (two) times daily. Patient not taking: Reported on 08/28/2014 06/05/14   Dorie Rank, MD  metroNIDAZOLE (FLAGYL) 500 MG tablet Take 1 tablet (500 mg total) by mouth 2 (two) times daily. One po bid x 7 days 08/28/14   Ernestina Patches, MD  naproxen (NAPROSYN) 500 MG tablet Take 1 tablet (500 mg total) by mouth 2 (two) times daily. 08/28/14   Ernestina Patches, MD  ondansetron (ZOFRAN) 4 MG tablet Take 1 tablet (4 mg total) by mouth every 6 (six) hours. Patient not taking: Reported on 08/28/2014 06/05/14   Dorie Rank, MD  phenazopyridine (PYRIDIUM) 200 MG  tablet Take 1 tablet (200 mg total) by mouth 3 (three) times daily as needed for pain. 08/28/14   Ernestina Patches, MD   BP 113/66 mmHg  Pulse 62  Temp(Src) 98.9 F (37.2 C) (Oral)  Resp 16  SpO2 98%  LMP 08/21/2014 Physical Exam  Constitutional: She is oriented to person, place, and time. She appears well-developed and well-nourished. No distress.   HENT:  Head: Normocephalic and atraumatic.  Mouth/Throat: No oropharyngeal exudate.  Eyes: Pupils are equal, round, and reactive to light.  Neck: Normal range of motion. Neck supple.  Cardiovascular: Normal rate, regular rhythm and normal heart sounds.  Exam reveals no gallop and no friction rub.   No murmur heard. Pulmonary/Chest: Effort normal and breath sounds normal. No respiratory distress. She has no wheezes. She has no rales.  Abdominal: Soft. Bowel sounds are normal. She exhibits no distension and no mass. There is tenderness in the suprapubic area. There is CVA tenderness (bilateral mild). There is no rebound and no guarding.  Genitourinary: Rectum normal and uterus normal. Cervix exhibits no motion tenderness and no friability. Right adnexum displays no mass. Left adnexum displays no mass.  Musculoskeletal: Normal range of motion. She exhibits no edema or tenderness.  Neurological: She is alert and oriented to person, place, and time.  Skin: Skin is warm and dry.  Psychiatric: She has a normal mood and affect.    ED Course  Procedures (including critical care time) Labs Review Labs Reviewed  WET PREP, GENITAL - Abnormal; Notable for the following:    Clue Cells Wet Prep HPF POC MANY (*)    WBC, Wet Prep HPF POC FEW (*)    All other components within normal limits  URINALYSIS, ROUTINE W REFLEX MICROSCOPIC - Abnormal; Notable for the following:    Hgb urine dipstick LARGE (*)    Leukocytes, UA LARGE (*)    All other components within normal limits  URINE MICROSCOPIC-ADD ON - Abnormal; Notable for the following:    Squamous Epithelial / LPF MANY (*)    Bacteria, UA FEW (*)    All other components within normal limits  URINE CULTURE  GC/CHLAMYDIA PROBE AMP  POC URINE PREG, ED    Imaging Review No results found.   EKG Interpretation None      MDM   Final diagnoses:  UTI (lower urinary tract infection)  BV (bacterial vaginosis)    Pt is a 39 y.o. female with  Pmhx as above who presents with to 3 days of dysuria, frequency, subjective fevers and chills, bilateral low back pain and vaginal discharge.  On physical exam, vital signs are stable.  Patient is in no acute distress.  She has mild bilateral CVA tenderness.  Positive suprapubic tenderness on exam.  She is no rebound or guarding.  We'll obtain UA with culture pointer pregnancy test and pelvic exam.  Pelvic exam grossly nml.  Urine infected, wet prep with clue cells. Will d/c  home with Keflex and Flagyl  Reve D Bernabe evaluation in the Emergency Department is complete. It has been determined that no acute conditions requiring further emergency intervention are present at this time. The patient/guardian have been advised of the diagnosis and plan. We have discussed signs and symptoms that warrant return to the ED, such as changes or worsening in symptoms, worsening pain, fever, inability to tolerate liquids.      Ernestina Patches, MD 08/28/14 [phone]

## 2014-08-29 LAB — URINE CULTURE

## 2014-08-29 LAB — GC/CHLAMYDIA PROBE AMP
CT Probe RNA: NEGATIVE
GC PROBE AMP APTIMA: NEGATIVE

## 2014-09-30 ENCOUNTER — Encounter: Payer: Self-pay | Admitting: Family Medicine

## 2014-09-30 ENCOUNTER — Ambulatory Visit (INDEPENDENT_AMBULATORY_CARE_PROVIDER_SITE_OTHER): Payer: Medicaid Other | Admitting: Family Medicine

## 2014-09-30 ENCOUNTER — Other Ambulatory Visit (HOSPITAL_COMMUNITY)
Admission: RE | Admit: 2014-09-30 | Discharge: 2014-09-30 | Disposition: A | Discharge status: Home / Self Care | Payer: Medicaid Other | Source: Ambulatory Visit | Attending: Obstetrics & Gynecology | Admitting: Obstetrics & Gynecology

## 2014-09-30 VITALS — BP 126/79 | HR 84 | Temp 98.1°F | Ht 65.0 in | Wt 216.7 lb

## 2014-09-30 DIAGNOSIS — H9193 Unspecified hearing loss, bilateral: Secondary | ICD-10-CM | POA: Diagnosis not present

## 2014-09-30 DIAGNOSIS — Z01411 Encounter for gynecological examination (general) (routine) with abnormal findings: Secondary | ICD-10-CM | POA: Diagnosis not present

## 2014-09-30 DIAGNOSIS — R102 Pelvic and perineal pain: Secondary | ICD-10-CM

## 2014-09-30 DIAGNOSIS — Z124 Encounter for screening for malignant neoplasm of cervix: Secondary | ICD-10-CM | POA: Diagnosis not present

## 2014-09-30 DIAGNOSIS — Z1151 Encounter for screening for human papillomavirus (HPV): Secondary | ICD-10-CM | POA: Insufficient documentation

## 2014-09-30 DIAGNOSIS — H9190 Unspecified hearing loss, unspecified ear: Secondary | ICD-10-CM | POA: Insufficient documentation

## 2014-09-30 DIAGNOSIS — J9383 Other pneumothorax: Secondary | ICD-10-CM | POA: Diagnosis not present

## 2014-09-30 DIAGNOSIS — N879 Dysplasia of cervix uteri, unspecified: Secondary | ICD-10-CM | POA: Diagnosis not present

## 2014-09-30 HISTORY — DX: Pelvic and perineal pain: R10.2

## 2014-09-30 NOTE — Progress Notes (Signed)
    Subjective:    Patient ID: Gloria Ramos is a 39 y.o. female presenting with Follow-up  on 09/30/2014  HPI: Moved here from [place]. Needs cervical cancer checked.  Notes severe pain in lower abdomen.  Notes it is lower abdomen and bilateral. Been present x 3 months.  Worse in days prior to cycle. Cycles are monthly and coming a few days early and lasting a little longer and they seem heavier. Negative urine culture and GC/Chlam in ED 08/2014. Treated for UTI and BV--no improvement in symptoms. Reports h/o PID.  Review of Systems  Constitutional: Negative for chills and fatigue.  Respiratory: Negative for shortness of breath.   Cardiovascular: Positive for chest pain.  Gastrointestinal: Positive for abdominal pain.  Genitourinary: Positive for vaginal discharge.  Musculoskeletal:       Notes some LE edema      Objective:    BP 126/79 mmHg  Pulse 84  Temp(Src) 98.1 F (36.7 C)  Ht 5\' 5"  (1.651 m)  Wt 216 lb 11.2 oz (98.294 kg)  BMI 36.06 kg/m2  LMP 09/18/2014 Physical Exam  Constitutional: She is oriented to person, place, and time. She appears well-developed and well-nourished. No distress.  HENT:  Head: Normocephalic and atraumatic.  Eyes: No scleral icterus.  Neck: Neck supple.  Cardiovascular: Normal rate.   Pulmonary/Chest: Effort normal.  Abdominal: Soft.  Genitourinary:  BUS normal, vagina is pink and rugated, cervix is multiparous without lesion, diffusely tender pelvis with some CMT   Neurological: She is alert and oriented to person, place, and time.  Skin: Skin is warm and dry.  Psychiatric: She has a normal mood and affect.        Assessment & Plan:   Problem List Items Addressed This Visit      Unprioritized   Spontaneous pneumothorax   Cervical dysplasia    No follow-up--needs pap today      Relevant Orders   Cytology - PAP   Pelvic pain in female - Primary    Unclear etiology--check u/s      Relevant Orders   US Pelvis Complete   US Transvaginal Non-OB   Decreased hearing       Return in about 3 months (around 12/29/2014) for a follow-up.

## 2014-09-30 NOTE — Patient Instructions (Signed)
Pelvic Pain Female pelvic pain can be caused by many different things and start from a variety of places. Pelvic pain refers to pain that is located in the lower half of the abdomen and between your hips. The pain may occur over a short period of time (acute) or may be reoccurring (chronic). The cause of pelvic pain may be related to disorders affecting the female reproductive organs (gynecologic), but it may also be related to the bladder, kidney stones, an intestinal complication, or muscle or skeletal problems. Getting help right away for pelvic pain is important, especially if there has been severe, sharp, or a sudden onset of unusual pain. It is also important to get help right away because some types of pelvic pain can be life threatening.  CAUSES  Below are only some of the causes of pelvic pain. The causes of pelvic pain can be in one of several categories.   Gynecologic.  Pelvic inflammatory disease.  Sexually transmitted infection.  Ovarian cyst or a twisted ovarian ligament (ovarian torsion).  Uterine lining that grows outside the uterus (endometriosis).  Fibroids, cysts, or tumors.  Ovulation.  Pregnancy.  Pregnancy that occurs outside the uterus (ectopic pregnancy).  Miscarriage.  Labor.  Abruption of the placenta or ruptured uterus.  Infection.  Uterine infection (endometritis).  Bladder infection.  Diverticulitis.  Miscarriage related to a uterine infection (septic abortion).  Bladder.  Inflammation of the bladder (cystitis).  Kidney Lavallie(s).  Gastrointestinal.  Constipation.  Diverticulitis.  Neurologic.  Trauma.  Feeling pelvic pain because of mental or emotional causes (psychosomatic).  Cancers of the bowel or pelvis. EVALUATION  Your caregiver will want to take a careful history of your concerns. This includes recent changes in your health, a careful gynecologic history of your periods (menses), and a sexual history. Obtaining your family  history and medical history is also important. Your caregiver may suggest a pelvic exam. A pelvic exam will help identify the location and severity of the pain. It also helps in the evaluation of which organ system may be involved. In order to identify the cause of the pelvic pain and be properly treated, your caregiver may order tests. These tests may include:   A pregnancy test.  Pelvic ultrasonography.  An X-ray exam of the abdomen.  A urinalysis or evaluation of vaginal discharge.  Blood tests. HOME CARE INSTRUCTIONS   Only take over-the-counter or prescription medicines for pain, discomfort, or fever as directed by your caregiver.   Rest as directed by your caregiver.   Eat a balanced diet.   Drink enough fluids to make your urine clear or pale yellow, or as directed.   Avoid sexual intercourse if it causes pain.   Apply warm or cold compresses to the lower abdomen depending on which one helps the pain.   Avoid stressful situations.   Keep a journal of your pelvic pain. Write down when it started, where the pain is located, and if there are things that seem to be associated with the pain, such as food or your menstrual cycle.  Follow up with your caregiver as directed.  SEEK MEDICAL CARE IF:  Your medicine does not help your pain.  You have abnormal vaginal discharge. SEEK IMMEDIATE MEDICAL CARE IF:   You have heavy bleeding from the vagina.   Your pelvic pain increases.   You feel light-headed or faint.   You have chills.   You have pain with urination or blood in your urine.   You have uncontrolled diarrhea   or vomiting.   You have a fever or persistent symptoms for more than 3 days.  You have a fever and your symptoms suddenly get worse.   You are being physically or sexually abused.  MAKE SURE YOU:  Understand these instructions.  Will watch your condition.  Will get help if you are not doing well or get worse. Document Released:  07/04/2004 Document Revised: 12/22/2013 Document Reviewed: 11/27/2011 [facility] Patient Information 2015 [place], [place]. This information is not intended to replace advice given to you by your health care provider. Make sure you discuss any questions you have with your health care provider. Cervical Dysplasia Cervical dysplasia is a condition in which a woman has abnormal changes in the cells of her cervix. The cervix is the opening to the uterus (womb). It is located between the vagina and the uterus. Cervical dysplasia may be the first sign of cervical cancer.  With early detection, treatment, and close follow-up care, nearly all cases of cervical dysplasia can be cured. If left untreated, dysplasia may become more severe.  CAUSES  Cervical dysplasia can be caused by a human papillomavirus (HPV) infection. RISK FACTORS   Having had a sexually transmitted disease, such as chlamydia or a human papillomavirus (HPV) infection.   Becoming sexually active before age 66.   Having had more than one sexual partner.   Not using protection during sexual intercourse, especially with new sexual partners.   Having had cancer of the vagina or vulva.   Having a sexual partner whose previous partner had cancer of the cervix or cervical dysplasia.   Having a sexual partner who has or has had cancer of the penis.   Having a weakened immune system (such as from having HIV or an organ transplant).   Being the daughter of a woman who took diethylstilbestrol(DES) during pregnancy.   Having a family history of cervical cancer.   Smoking. SIGNS AND SYMPTOMS  There are usually no symptoms. If there are symptoms, they may include:   Abnormal vaginal discharge.   Bleeding between periods or after intercourse.   Bleeding during menopause.   Pain during sexual intercourse (dyspareunia). DIAGNOSIS  A test called a Pap test may be done.During this test, cells are taken from the cervix and  then looked at under a microscope. A test in which tissue is removed from the cervix (biopsy) may also be done if the Pap test is abnormal or if the cervix looks abnormal.  TREATMENT  Treatment varies based on the severity of the cervical dysplasia. Treatment may include:  Cryotherapy. During cryotherapy, the abnormal cells are frozen with a steel-tip instrument.   A procedure to remove abnormal tissue from the cervix.  Surgery to remove abnormal tissue. This is usually done in serious cases of cervical dysplasia. Surgical options include:  A cone biopsy. This is a procedure in which the cervical canal and a portion of the center of the cervix are removed.   Hysterectomy. This is a surgery in which the uterus and cervix are removed. HOME CARE INSTRUCTIONS   Only take over-the-counter or prescription medicines for pain or discomfort as directed by your health care provider.   Do not use tampons, have sexual intercourse, or douche until your health care provider says it is okay.  Keep follow-up appointments as directed by your health care provider. Women who have been treated for cervical dysplasia should have regular pelvic exams and Pap tests. During the first year following treatment of cervical dysplasia, Pap tests  should be done every 3-4 months. In the second year, they should be done every 6 months or as recommended by your health care provider.  To prevent the condition from developing again, practice safe sex. SEEK MEDICAL CARE IF:  You develop genital warts.  SEEK IMMEDIATE MEDICAL CARE IF:   Your menstrual period is heavier than normal.   You develop bright red bleeding, especially if you have blood clots.   You have a fever.   You have increasing cramps or pain not relieved with medicine.   You are light-headed, unusually weak, or have fainting spells.   You have abnormal vaginal discharge.   You have abdominal pain. Document Released: 08/07/2005 Document  Revised: 08/12/2013 Document Reviewed: 04/02/2013 [facility] Patient Information 2015 [place], [place]. This information is not intended to replace advice given to you by your health care provider. Make sure you discuss any questions you have with your health care provider.

## 2014-10-01 LAB — CYTOLOGY - PAP

## 2014-10-01 NOTE — Assessment & Plan Note (Signed)
Unclear etiology--check u/s

## 2014-10-01 NOTE — Assessment & Plan Note (Signed)
No follow-up--needs pap today

## 2014-10-02 ENCOUNTER — Telehealth: Payer: Self-pay | Admitting: *Deleted

## 2014-10-02 NOTE — Telephone Encounter (Signed)
Needs precert for pelvic ultrasound for 10/06/15

## 2014-10-02 NOTE — Telephone Encounter (Signed)
done

## 2014-10-05 ENCOUNTER — Ambulatory Visit (HOSPITAL_COMMUNITY): Payer: Medicaid Other | Attending: Family Medicine

## 2014-10-14 ENCOUNTER — Ambulatory Visit (HOSPITAL_COMMUNITY)
Admission: RE | Admit: 2014-10-14 | Discharge: 2014-10-14 | Disposition: A | Discharge status: Home / Self Care | Payer: Medicaid Other | Source: Ambulatory Visit | Attending: Family Medicine | Admitting: Family Medicine

## 2014-10-14 DIAGNOSIS — R102 Pelvic and perineal pain: Secondary | ICD-10-CM | POA: Insufficient documentation

## 2014-10-20 ENCOUNTER — Encounter (HOSPITAL_COMMUNITY): Payer: Self-pay | Admitting: Emergency Medicine

## 2014-10-20 ENCOUNTER — Emergency Department (HOSPITAL_COMMUNITY)
Admission: EM | Admit: 2014-10-20 | Discharge: 2014-10-21 | Disposition: A | Discharge status: Home / Self Care | Payer: Medicaid Other | Attending: Emergency Medicine | Admitting: Emergency Medicine

## 2014-10-20 ENCOUNTER — Emergency Department (HOSPITAL_COMMUNITY): Payer: Medicaid Other

## 2014-10-20 DIAGNOSIS — Z791 Long term (current) use of non-steroidal anti-inflammatories (NSAID): Secondary | ICD-10-CM | POA: Diagnosis not present

## 2014-10-20 DIAGNOSIS — Z9851 Tubal ligation status: Secondary | ICD-10-CM | POA: Diagnosis not present

## 2014-10-20 DIAGNOSIS — Z3202 Encounter for pregnancy test, result negative: Secondary | ICD-10-CM | POA: Diagnosis not present

## 2014-10-20 DIAGNOSIS — R079 Chest pain, unspecified: Secondary | ICD-10-CM | POA: Insufficient documentation

## 2014-10-20 DIAGNOSIS — Z91040 Latex allergy status: Secondary | ICD-10-CM | POA: Diagnosis not present

## 2014-10-20 DIAGNOSIS — R109 Unspecified abdominal pain: Secondary | ICD-10-CM | POA: Diagnosis present

## 2014-10-20 DIAGNOSIS — N760 Acute vaginitis: Secondary | ICD-10-CM | POA: Diagnosis not present

## 2014-10-20 DIAGNOSIS — Z862 Personal history of diseases of the blood and blood-forming organs and certain disorders involving the immune mechanism: Secondary | ICD-10-CM | POA: Diagnosis not present

## 2014-10-20 DIAGNOSIS — Z79899 Other long term (current) drug therapy: Secondary | ICD-10-CM | POA: Insufficient documentation

## 2014-10-20 DIAGNOSIS — B9689 Other specified bacterial agents as the cause of diseases classified elsewhere: Secondary | ICD-10-CM

## 2014-10-20 DIAGNOSIS — Z720 Tobacco use: Secondary | ICD-10-CM | POA: Insufficient documentation

## 2014-10-20 DIAGNOSIS — R112 Nausea with vomiting, unspecified: Secondary | ICD-10-CM | POA: Insufficient documentation

## 2014-10-20 DIAGNOSIS — Z9889 Other specified postprocedural states: Secondary | ICD-10-CM | POA: Diagnosis not present

## 2014-10-20 DIAGNOSIS — R102 Pelvic and perineal pain: Secondary | ICD-10-CM

## 2014-10-20 DIAGNOSIS — R195 Other fecal abnormalities: Secondary | ICD-10-CM | POA: Diagnosis not present

## 2014-10-20 DIAGNOSIS — Z8719 Personal history of other diseases of the digestive system: Secondary | ICD-10-CM | POA: Insufficient documentation

## 2014-10-20 DIAGNOSIS — M069 Rheumatoid arthritis, unspecified: Secondary | ICD-10-CM | POA: Insufficient documentation

## 2014-10-20 DIAGNOSIS — Z8541 Personal history of malignant neoplasm of cervix uteri: Secondary | ICD-10-CM | POA: Insufficient documentation

## 2014-10-20 LAB — LIPASE, BLOOD: Lipase: 28 U/L (ref 11–59)

## 2014-10-20 LAB — CBC WITH DIFFERENTIAL/PLATELET
BASOS ABS: 0 10*3/uL (ref 0.0–0.1)
BASOS PCT: 0 % (ref 0–1)
EOS ABS: 0.2 10*3/uL (ref 0.0–0.7)
EOS PCT: 3 % (ref 0–5)
HEMATOCRIT: 39.7 % (ref 36.0–46.0)
Hemoglobin: 12.5 g/dL (ref 12.0–15.0)
Lymphocytes Relative: 34 % (ref 12–46)
Lymphs Abs: 2.6 10*3/uL (ref 0.7–4.0)
MCH: 25.2 pg — ABNORMAL LOW (ref 26.0–34.0)
MCHC: 31.5 g/dL (ref 30.0–36.0)
MCV: 80 fL (ref 78.0–100.0)
MONO ABS: 0.6 10*3/uL (ref 0.1–1.0)
Monocytes Relative: 7 % (ref 3–12)
Neutro Abs: 4.3 10*3/uL (ref 1.7–7.7)
Neutrophils Relative %: 56 % (ref 43–77)
Platelets: 272 10*3/uL (ref 150–400)
RBC: 4.96 MIL/uL (ref 3.87–5.11)
RDW: 17.3 % — AB (ref 11.5–15.5)
WBC: 7.7 10*3/uL (ref 4.0–10.5)

## 2014-10-20 LAB — COMPREHENSIVE METABOLIC PANEL
ALT: 23 U/L (ref 0–35)
AST: 27 U/L (ref 0–37)
Albumin: 4.3 g/dL (ref 3.5–5.2)
Alkaline Phosphatase: 72 U/L (ref 39–117)
Anion gap: 9 (ref 5–15)
BUN: 12 mg/dL (ref 6–23)
CO2: 22 mmol/L (ref 19–32)
Calcium: 9.2 mg/dL (ref 8.4–10.5)
Chloride: 109 mmol/L (ref 96–112)
Creatinine, Ser: 0.77 mg/dL (ref 0.50–1.10)
GFR calc Af Amer: >90 mL/min (ref >90)
GLUCOSE: 91 mg/dL (ref 70–99)
Potassium: 4.1 mmol/L (ref 3.5–5.1)
Sodium: 140 mmol/L (ref 135–145)
Total Bilirubin: 0.5 mg/dL (ref 0.3–1.2)
Total Protein: 7.5 g/dL (ref 6.0–8.3)

## 2014-10-20 LAB — URINALYSIS, ROUTINE W REFLEX MICROSCOPIC
Glucose, UA: NEGATIVE mg/dL
HGB URINE DIPSTICK: NEGATIVE
KETONES UR: 15 mg/dL — AB
Leukocytes, UA: NEGATIVE
Nitrite: NEGATIVE
PROTEIN: NEGATIVE mg/dL
Specific Gravity, Urine: 1.024 (ref 1.005–1.030)
Urobilinogen, UA: 1 mg/dL (ref 0.0–1.0)
pH: 6 (ref 5.0–8.0)

## 2014-10-20 LAB — POC URINE PREG, ED: Preg Test, Ur: NEGATIVE

## 2014-10-20 MED ORDER — SODIUM CHLORIDE 0.9 % IV BOLUS (SEPSIS)
1000.0000 mL | Freq: Once | INTRAVENOUS | Status: AC
  Administered 2014-10-20: 1000 mL via INTRAVENOUS

## 2014-10-20 MED ORDER — FENTANYL CITRATE 0.05 MG/ML IJ SOLN
50.0000 ug | Freq: Once | INTRAMUSCULAR | Status: DC
Start: 2014-10-20 — End: 2014-10-20

## 2014-10-20 MED ORDER — FENTANYL CITRATE 0.05 MG/ML IJ SOLN
50.0000 ug | Freq: Once | INTRAMUSCULAR | Status: AC
  Administered 2014-10-20: 50 ug via INTRAVENOUS
  Filled 2014-10-20: qty 2

## 2014-10-20 NOTE — ED Provider Notes (Signed)
CSN: [id number]     Arrival date & time 10/20/14  1747 History   First MD Initiated Contact with Patient 10/20/14 2242     Chief Complaint  Patient presents with  . Abdominal Pain  . Melena     (Consider location/radiation/quality/duration/timing/severity/associated sxs/prior Treatment) The history is provided by the patient. No language interpreter was used.  Gloria Ramos is a 39 y/o F with PMHx of anemia, RA, Crohn's, colitis, cervical cancer, edema presenting to the ED with severe abdominal pain that has been ongoing for the past 3 days. Patient reported that the pain is localized to the lower portion of her abdomen described as a sharp constant pain. Stated the pain radiates to her pelvis as well. Reported that she has been having nausea and stated that she has not been able to keep any food or fluids down. Patient stated that she has been having melenic, bright red blood in the stools. Patient stated that she has been using aleve without relief. Patient reported that she has dealing with Crohn's disease since she was 23-24 years ago. Reported that she has been having dysuria and vaginal discharge for approximately one week. Reported that she is not followed by a gastroenterologist. Denied chest pain, shortness of breath, difficulty breathing, hematuria, syncope, travel, leg swelling, neck pain. PCP none  Past Medical History  Diagnosis Date  . Anemia   . Arthritis   . Rheumatoid arthritis   . Crohn's disease   . Colitis   . Collapsed lung   . Cervical cancer   . Edema    Past Surgical History  Procedure Laterality Date  . Abdominal surgery    . Cervical cone biopsy  2015  . Tubal ligation     Family History  Problem Relation Age of Onset  . Cancer Maternal Aunt     cervical   History  Substance Use Topics  . Smoking status: Current Every Day Smoker -- 0.50 packs/day for 25 years  . Smokeless tobacco: Not on file  . Alcohol Use: Yes     Comment: rare   OB History     Gravida Para Term Preterm AB TAB SAB Ectopic Multiple Living   5 3 3  2  2   3      Review of Systems  Constitutional: Negative for fever and chills.  Respiratory: Negative for chest tightness and shortness of breath.   Cardiovascular: Positive for chest pain.  Gastrointestinal: Positive for nausea, vomiting, abdominal pain and blood in stool. Negative for diarrhea, constipation and anal bleeding.  Genitourinary: Positive for dysuria, vaginal discharge and pelvic pain. Negative for hematuria, flank pain, decreased urine volume, vaginal bleeding and vaginal pain.  Musculoskeletal: Positive for back pain. Negative for neck pain and neck stiffness.      Allergies  Codeine; Food; Hydrocodone; Latex; and Oxycodone  Home Medications   Prior to Admission medications   Medication Sig Start Date End Date Taking? Authorizing Provider  Multiple Vitamins-Minerals (MULTIVITAMIN WITH MINERALS) tablet Take 1 tablet by mouth daily.   Yes Historical Provider, MD  naproxen sodium (ANAPROX) 220 MG tablet Take 440 mg by mouth 2 (two) times daily with a meal.   Yes Historical Provider, MD   BP 114/70 mmHg  Pulse 84  Temp(Src) 98.9 F (37.2 C) (Oral)  Resp 21  SpO2 100%  LMP 09/14/2014 Physical Exam  Constitutional: She is oriented to person, place, and time. She appears well-developed and well-nourished. No distress.  HENT:  Head: Normocephalic and atraumatic.  Eyes: Conjunctivae and EOM are normal. Pupils are equal, round, and reactive to light. Right eye exhibits no discharge. Left eye exhibits no discharge.  Neck: Normal range of motion. Neck supple.  Cardiovascular: Normal rate, regular rhythm and normal heart sounds.  Exam reveals no friction rub.   No murmur heard. Pulmonary/Chest: Effort normal and breath sounds normal. No respiratory distress. She has no wheezes. She has no rales.  Abdominal: Soft. Bowel sounds are normal. She exhibits no distension. There is tenderness in the right lower  quadrant, suprapubic area and left lower quadrant. There is no rebound and no guarding.  Genitourinary: Guaiac positive stool. No vaginal discharge found.  Pelvic Exam: Negative swelling, erythema, inflammation, lesions, sores, deformities noted to the anus. Bright red blood noted in the vaginal vault. Cervix identified with negative friability. CMT and adnexal tenderness noted bilaterally.  Exam chaperoned with tech, Vallarie Mare.   Rectal Exam: Negative swelling, erythema, inflammation, lesions, sores, deformities identified. External hemorrhoids noted with negative signs of thrombosis. Negative BRBPR. Thin blood tinged mucus noted on glove.  Exam chaperoned with tech, Vallarie Mare.   Musculoskeletal: Normal range of motion.  Neurological: She is alert and oriented to person, place, and time. No cranial nerve deficit. She exhibits normal muscle tone. Coordination normal.  Skin: Skin is warm and dry. No rash noted. She is not diaphoretic. No erythema.  Psychiatric: She has a normal mood and affect. Her behavior is normal. Thought content normal.  Nursing note and vitals reviewed.   ED Course  Procedures (including critical care time)  Results for orders placed or performed during the hospital encounter of 10/20/14  CBC with Differential  Result Value Ref Range   WBC 7.7 4.0 - 10.5 K/uL   RBC 4.96 3.87 - 5.11 MIL/uL   Hemoglobin 12.5 12.0 - 15.0 g/dL   HCT 39.7 36.0 - 46.0 %   MCV 80.0 78.0 - 100.0 fL   MCH 25.2 (L) 26.0 - 34.0 pg   MCHC 31.5 30.0 - 36.0 g/dL   RDW 17.3 (H) 11.5 - 15.5 %   Platelets 272 150 - 400 K/uL   Neutrophils Relative % 56 43 - 77 %   Neutro Abs 4.3 1.7 - 7.7 K/uL   Lymphocytes Relative 34 12 - 46 %   Lymphs Abs 2.6 0.7 - 4.0 K/uL   Monocytes Relative 7 3 - 12 %   Monocytes Absolute 0.6 0.1 - 1.0 K/uL   Eosinophils Relative 3 0 - 5 %   Eosinophils Absolute 0.2 0.0 - 0.7 K/uL   Basophils Relative 0 0 - 1 %   Basophils Absolute 0.0 0.0 - 0.1 K/uL  Comprehensive metabolic  panel  Result Value Ref Range   Sodium 140 135 - 145 mmol/L   Potassium 4.1 3.5 - 5.1 mmol/L   Chloride 109 96 - 112 mmol/L   CO2 22 19 - 32 mmol/L   Glucose, Bld 91 70 - 99 mg/dL   BUN 12 6 - 23 mg/dL   Creatinine, Ser 0.77 0.50 - 1.10 mg/dL   Calcium 9.2 8.4 - 10.5 mg/dL   Total Protein 7.5 6.0 - 8.3 g/dL   Albumin 4.3 3.5 - 5.2 g/dL   AST 27 0 - 37 U/L   ALT 23 0 - 35 U/L   Alkaline Phosphatase 72 39 - 117 U/L   Total Bilirubin 0.5 0.3 - 1.2 mg/dL   GFR calc non Af Amer >90 >90 mL/min   GFR calc Af Amer >90 >90 mL/min  Anion gap 9 5 - 15  Lipase, blood  Result Value Ref Range   Lipase 28 11 - 59 U/L  Urinalysis, Routine w reflex microscopic  Result Value Ref Range   Color, Urine YELLOW YELLOW   APPearance CLOUDY (A) CLEAR   Specific Gravity, Urine 1.024 1.005 - 1.030   pH 6.0 5.0 - 8.0   Glucose, UA NEGATIVE NEGATIVE mg/dL   Hgb urine dipstick NEGATIVE NEGATIVE   Bilirubin Urine SMALL (A) NEGATIVE   Ketones, ur 15 (A) NEGATIVE mg/dL   Protein, ur NEGATIVE NEGATIVE mg/dL   Urobilinogen, UA 1.0 0.0 - 1.0 mg/dL   Nitrite NEGATIVE NEGATIVE   Leukocytes, UA NEGATIVE NEGATIVE  Troponin I  Result Value Ref Range   Troponin I <0.03 <0.031 ng/mL  POC Urine Pregnancy, ED  (If Pre-menopausal female) - do not order at [facility]  Result Value Ref Range   Preg Test, Ur NEGATIVE NEGATIVE  I-Stat CG4 Lactic Acid, ED  Result Value Ref Range   Lactic Acid, Venous 0.76 0.5 - 2.0 mmol/L  POC occult blood, ED  Result Value Ref Range   Fecal Occult Bld POSITIVE (A) NEGATIVE   Labs Review Labs Reviewed  CBC WITH DIFFERENTIAL/PLATELET - Abnormal; Notable for the following:    MCH 25.2 (*)    RDW 17.3 (*)    All other components within normal limits  URINALYSIS, ROUTINE W REFLEX MICROSCOPIC - Abnormal; Notable for the following:    APPearance CLOUDY (*)    Bilirubin Urine SMALL (*)    Ketones, ur 15 (*)    All other components within normal limits  POC OCCULT BLOOD, ED - Abnormal;  Notable for the following:    Fecal Occult Bld POSITIVE (*)    All other components within normal limits  WET PREP, GENITAL  COMPREHENSIVE METABOLIC PANEL  LIPASE, BLOOD  TROPONIN I  OCCULT BLOOD X 1 CARD TO LAB, STOOL  POC URINE PREG, ED  I-STAT CG4 LACTIC ACID, ED  GC/CHLAMYDIA PROBE AMP ([place])    Imaging Review Dg Abd Acute W/chest  10/21/2014   CLINICAL DATA:  Acute onset of upper mid abdominal pain and nausea. Initial encounter.  EXAM: ACUTE ABDOMEN SERIES (ABDOMEN 2 VIEW & CHEST 1 VIEW)  COMPARISON:  Chest radiograph performed 10/13/2013, and CT of the abdomen and pelvis from 06/05/2014  FINDINGS: The lungs are well-aerated. There is mild elevation of the left hemidiaphragm, with mild left basilar atelectasis. There is no evidence of focal opacification, pleural effusion or pneumothorax. The cardiomediastinal silhouette is within normal limits.  The visualized bowel gas pattern is unremarkable. Scattered stool and air are seen within the colon; there is no evidence of small bowel dilatation to suggest obstruction. No free intra-abdominal air is identified on the provided upright view.  No acute osseous abnormalities are seen; the sacroiliac joints are unremarkable in appearance.  IMPRESSION: 1. Unremarkable bowel gas pattern; no free intra-abdominal air seen. Small amount of stool noted in the colon. 2. Mild elevation of the left hemidiaphragm, with mild left basilar atelectasis. Lungs otherwise clear.   Electronically Signed   By: Garald Balding M.D.   On: 10/21/2014 01:20     EKG Interpretation None       12:33 AM Patient reported that she is able to take Morphine without complications.   1:14 AM Discussed case with Britt Bottom, NP. Transfer of care to Britt Bottom, NP at change in shift.   MDM   Final diagnoses:  Pelvic pain in female  Medications  sodium chloride 0.9 % bolus 1,000 mL (1,000 mLs Intravenous New Bag/Given 10/20/14 2354)  fentaNYL  (SUBLIMAZE) injection 50 mcg (50 mcg Intravenous Given 10/20/14 2354)  morphine 4 MG/ML injection 4 mg (4 mg Intravenous Given 10/21/14 0040)  ondansetron (ZOFRAN) injection 4 mg (4 mg Intravenous Given 10/21/14 0051)  iohexol (OMNIPAQUE) 300 MG/ML solution 50 mL (50 mLs Oral Contrast Given 10/21/14 0058)    Filed Vitals:   10/20/14 1804 10/20/14 2245 10/21/14 0132  BP: 122/79 127/78 114/70  Pulse: 100 100 84  Temp: 98.9 F (37.2 C)    TempSrc: Oral    Resp: 16 22 21   SpO2: 97% 98% 100%   This provider reviewed the patient's chart. Patient has been seen and assessed in late 2015 regarding abdominal pain. Patient was seen in October 2015 regarding abdominal pain with negative CT abdomen and pelvis with contrast for acute abdominal processes. Patient was recently seen at OB/GYN clinic on 10/14/2014 with pelvic ultrasound negative for acute abnormalities. EKG normal sinus rhythm with heart rate 85 bpm. Troponin negative elevation. CBC negative elevated leukocytosis. Hemoglobin 12.5, hematocrit 39.7. CMP unremarkable. Lipase negative elevation. Lactic acid negative elevation. Urine pregnancy negative. Urinalysis unremarkable-negative finding of infection. Fecal occult positive. Plain film of abdomen unremarkable bowel gas presentation - no free intra-abdominal air seen, small amount of stool noted in the colon. Mild elevation of the left hemidiaphragm with mild left basilar atelectasis-lungs otherwise clear. Fecal occult positive, hemoglobin and hematocrit within normal limits-and global 12.5, hematocrit 39.7. Negative findings of UTI or pyelonephritis. Abdominal exam tender upon palpation to the left lower quadrant, suprapubic and right lower quadrant - CT abdomen and pelvis with contrast ordered and pending. Discomfort noted on pelvic exam with new onset of vaginal bleeding that started while in ED setting-patient's last menstrual period was 3 days ago - pelvic ultrasound and Doppler ordered and pending.  Discussed case in great detail with Britt Bottom, NP. Transfer of care to Britt Bottom, NP at change in shift.  Jamse Mead, PA-C 10/21/14 0138  Jamse Mead, PA-C 10/21/14 0246  Julianne Rice, MD 10/21/14 [phone]

## 2014-10-20 NOTE — ED Notes (Signed)
Pt states that she has been passing blood in her stool x 3 days. Hx of chron's, ibs, and colitis. Lower abdominal pain and back pain. Had an US done at her PCP recently but has not seen results.

## 2014-10-21 ENCOUNTER — Emergency Department (HOSPITAL_COMMUNITY): Payer: Medicaid Other

## 2014-10-21 ENCOUNTER — Encounter (HOSPITAL_COMMUNITY): Payer: Self-pay

## 2014-10-21 LAB — GC/CHLAMYDIA PROBE AMP (~~LOC~~) NOT AT ARMC
CHLAMYDIA, DNA PROBE: NEGATIVE
NEISSERIA GONORRHEA: NEGATIVE

## 2014-10-21 LAB — WET PREP, GENITAL
TRICH WET PREP: NONE SEEN
Yeast Wet Prep HPF POC: NONE SEEN

## 2014-10-21 LAB — TROPONIN I

## 2014-10-21 LAB — I-STAT CG4 LACTIC ACID, ED: LACTIC ACID, VENOUS: 0.76 mmol/L (ref 0.5–2.0)

## 2014-10-21 LAB — POC OCCULT BLOOD, ED: Fecal Occult Bld: POSITIVE — AB

## 2014-10-21 MED ORDER — ONDANSETRON HCL 4 MG PO TABS: 4.0000 mg | ORAL_TABLET | Freq: Four times a day (QID) | ORAL | Status: DC

## 2014-10-21 MED ORDER — ONDANSETRON HCL 4 MG/2ML IJ SOLN
4.0000 mg | Freq: Once | INTRAMUSCULAR | Status: AC
  Administered 2014-10-21: 4 mg via INTRAVENOUS
  Filled 2014-10-21: qty 2

## 2014-10-21 MED ORDER — MORPHINE SULFATE 4 MG/ML IJ SOLN
4.0000 mg | Freq: Once | INTRAMUSCULAR | Status: AC
  Administered 2014-10-21: 4 mg via INTRAVENOUS
  Filled 2014-10-21: qty 1

## 2014-10-21 MED ORDER — TRAMADOL HCL 50 MG PO TABS: 50.0000 mg | ORAL_TABLET | Freq: Four times a day (QID) | ORAL | Status: DC | PRN

## 2014-10-21 MED ORDER — METRONIDAZOLE 500 MG PO TABS: 500.0000 mg | ORAL_TABLET | Freq: Two times a day (BID) | ORAL | Status: DC

## 2014-10-21 MED ORDER — IOHEXOL 300 MG/ML  SOLN
100.0000 mL | Freq: Once | INTRAMUSCULAR | Status: AC | PRN
  Administered 2014-10-21: 100 mL via INTRAVENOUS

## 2014-10-21 MED ORDER — ONDANSETRON HCL 4 MG/2ML IJ SOLN
4.0000 mg | INTRAMUSCULAR | Status: AC
  Administered 2014-10-21: 4 mg via INTRAVENOUS
  Filled 2014-10-21: qty 2

## 2014-10-21 MED ORDER — IOHEXOL 300 MG/ML  SOLN
50.0000 mL | Freq: Once | INTRAMUSCULAR | Status: AC | PRN
  Administered 2014-10-21: 50 mL via ORAL

## 2014-10-21 NOTE — ED Notes (Signed)
Patient transported to X-ray 

## 2014-10-21 NOTE — ED Provider Notes (Signed)
01:15 AM At end of shift, received hand-off report from [facility], [place].  Plan includes follow-up after [place] and CT abd.  If negative, may be discharged after PO challenge with pain meds and GI referral.  Pt currently resting without distress.   02:30 AM: [place] results reviewed and negative for abnormality.   03:40 AM: Pt returned from CT, resting but reports pain returning. Morphine and zofran ordered.   04:30 AM: Delta troponin repeated and negative, CT resulted and no acute abnormalities. Discussed findings with pt. Pt is well-appearing, in no acute distress and vital signs reviewed and not concerning. She appears safe to be discharged.  Discharge include follow-up with GI. Return precautions provided. Pt aware of plan and in agreement.    Filed Vitals:   10/21/14 0230 10/21/14 0330 10/21/14 0400 10/21/14 0430  BP: 108/72 118/70 118/66 105/55  Pulse: 80 90 79 74  Temp:      TempSrc:      Resp: 18 19 14 21   SpO2: 100% 99% 97% 94%            Britt Bottom, NP 10/21/14 1737  Julianne Rice, MD 10/21/14 2308

## 2014-10-21 NOTE — Discharge Instructions (Signed)
Please follow the instructions provided. Be sure to follow up with GI doctor for further evaluation of this pain. Please take antibiotics as directed until they're all gone. Use the pain medicine as needed along with the nausea medicine. Don't hesitate to return for any new, worsening, or concerning symptoms.   SEEK IMMEDIATE MEDICAL CARE IF:  You have heavy bleeding from the vagina.  Your pelvic pain increases.  You feel light-headed or faint.  You have chills.  You have pain with urination or blood in your urine.  You have uncontrolled diarrhea or vomiting.  You have a fever or persistent symptoms for more than 3 days.  You have a fever and your symptoms suddenly get worse.

## 2015-07-02 ENCOUNTER — Encounter: Payer: Self-pay | Admitting: Gastroenterology

## 2015-07-05 ENCOUNTER — Encounter: Payer: Self-pay | Admitting: Gastroenterology

## 2016-02-14 IMAGING — CR DG CHEST 2V
2 series · 2 of 2 positions shown · non-contrast
Comparison: None.

CLINICAL DATA: Cough and shortness of breath. History of smoking
and positive PPD.

EXAM:
CHEST  2 VIEW

[view not recorded (1 of 2)]
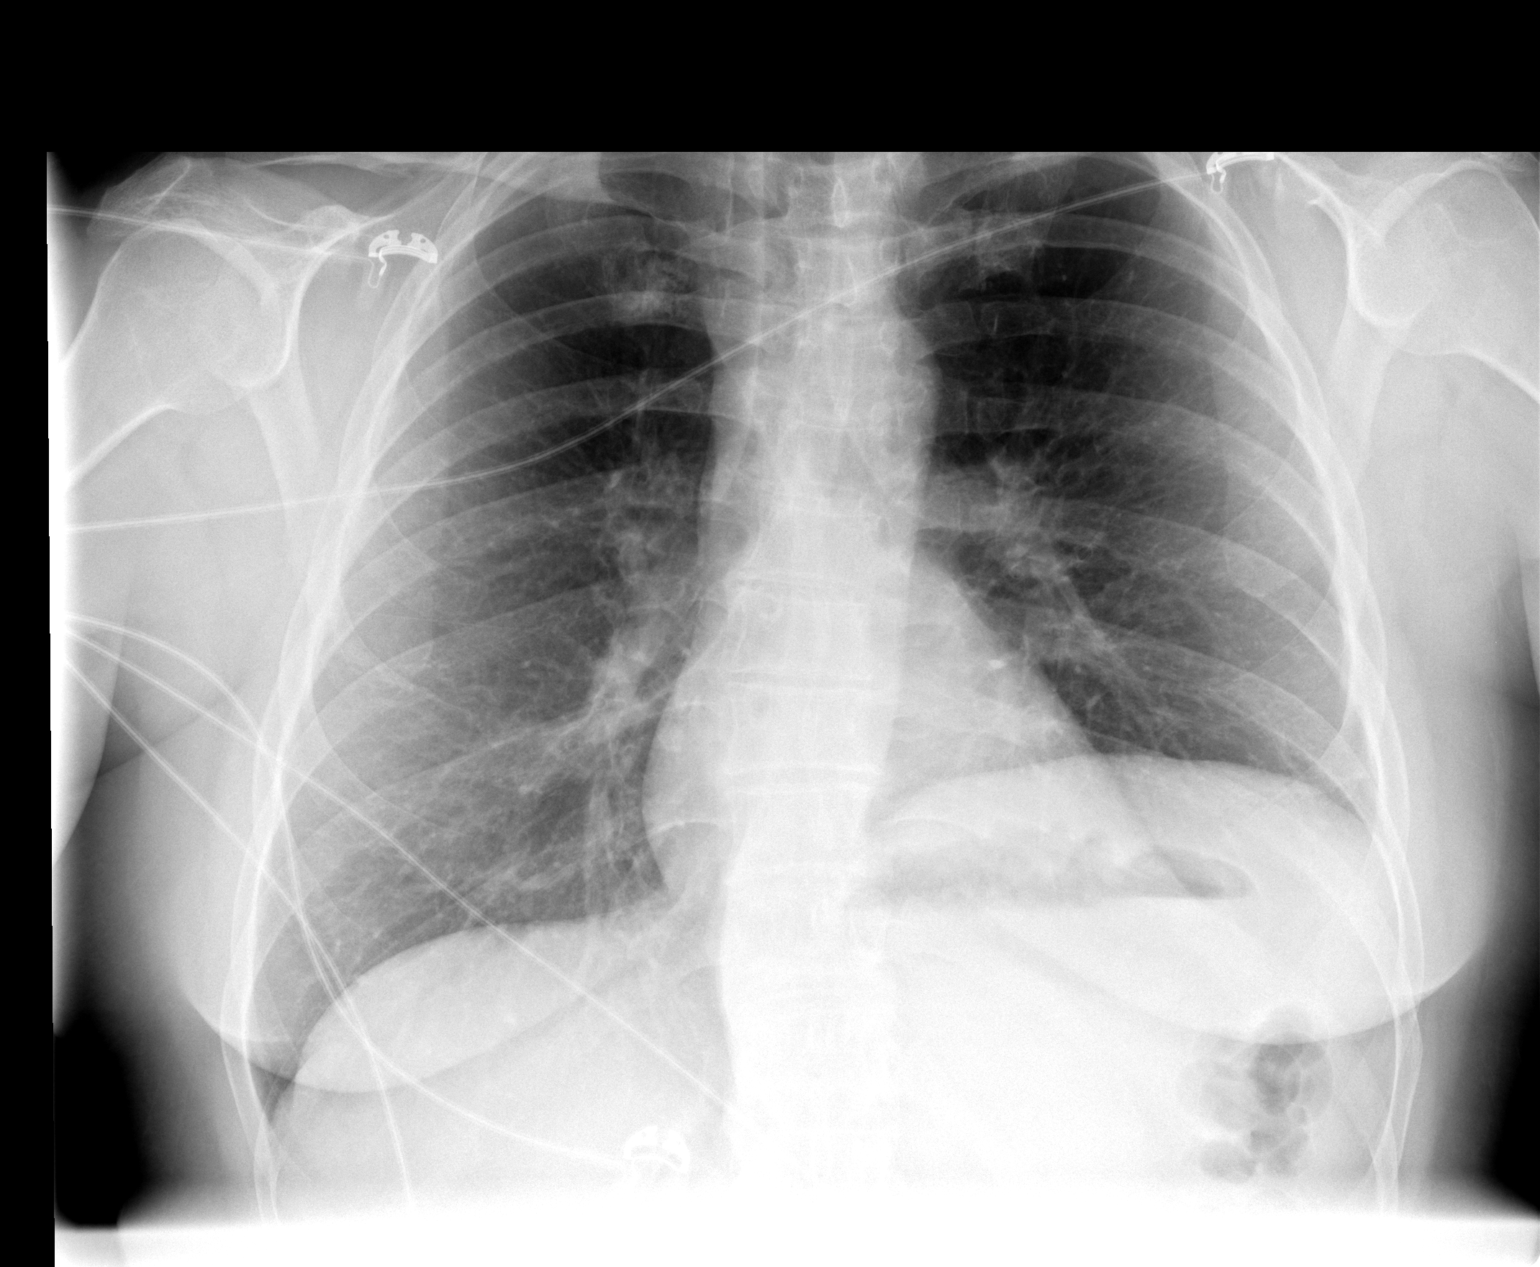

[view not recorded (2 of 2)]
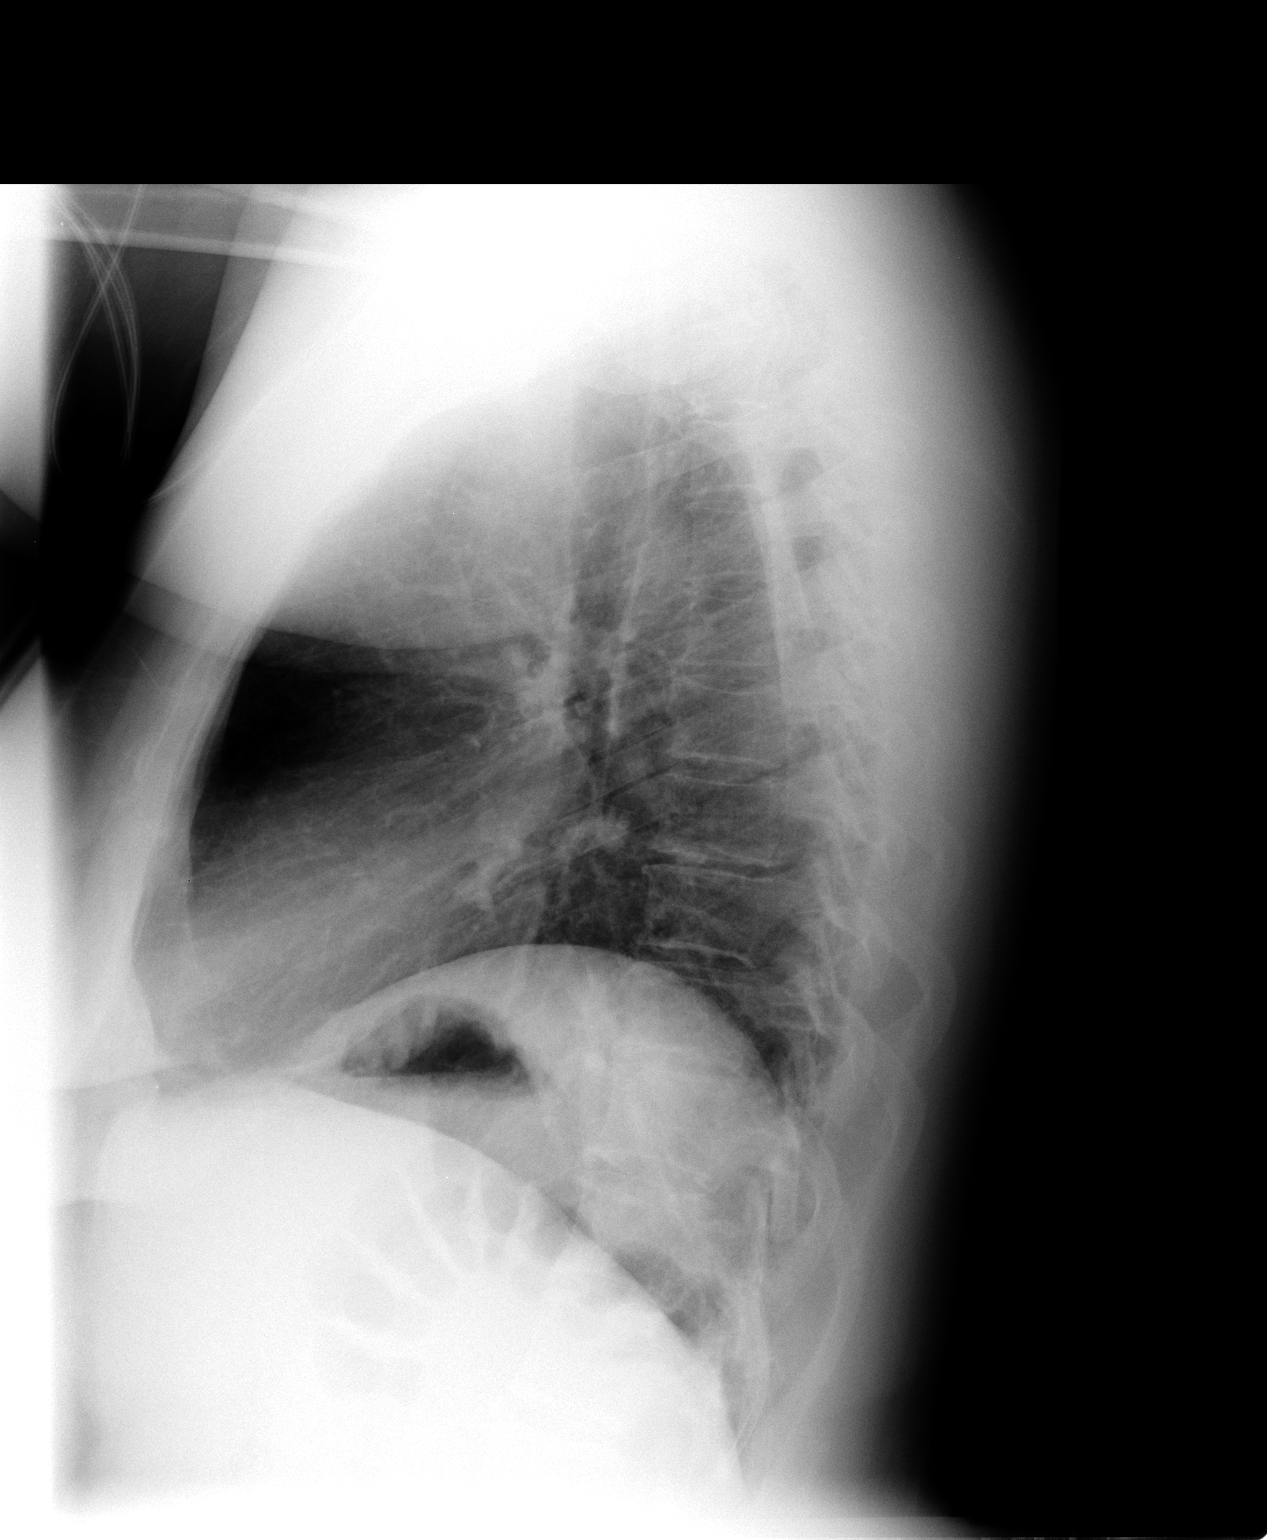

[2 of 2 positions shown; findings below may reference images not displayed]

FINDINGS: The lungs are well-aerated. There is elevation of the left
hemidiaphragm. Minimal right basilar opacity may reflect atelectasis
or possibly mild infection. There is no evidence of pleural effusion
or pneumothorax.

The heart is normal in size; the mediastinal contour is within
normal limits. No acute osseous abnormalities are seen.
IMPRESSION: Elevation of the right hemidiaphragm. Minimal right basilar airspace
opacity may reflect atelectasis or possibly mild infection.

## 2018-05-04 ENCOUNTER — Encounter (HOSPITAL_COMMUNITY): Payer: Self-pay | Admitting: *Deleted

## 2018-05-04 ENCOUNTER — Emergency Department (HOSPITAL_COMMUNITY)
Admission: EM | Admit: 2018-05-04 | Discharge: 2018-05-05 | Disposition: A | Discharge status: Home / Self Care | Payer: Self-pay | Attending: Emergency Medicine | Admitting: Emergency Medicine

## 2018-05-04 ENCOUNTER — Other Ambulatory Visit: Payer: Self-pay

## 2018-05-04 DIAGNOSIS — N390 Urinary tract infection, site not specified: Secondary | ICD-10-CM | POA: Insufficient documentation

## 2018-05-04 DIAGNOSIS — Z8541 Personal history of malignant neoplasm of cervix uteri: Secondary | ICD-10-CM | POA: Insufficient documentation

## 2018-05-04 DIAGNOSIS — F17210 Nicotine dependence, cigarettes, uncomplicated: Secondary | ICD-10-CM | POA: Insufficient documentation

## 2018-05-04 DIAGNOSIS — R109 Unspecified abdominal pain: Secondary | ICD-10-CM

## 2018-05-04 DIAGNOSIS — Z79899 Other long term (current) drug therapy: Secondary | ICD-10-CM | POA: Insufficient documentation

## 2018-05-04 LAB — URINALYSIS, ROUTINE W REFLEX MICROSCOPIC
BACTERIA UA: NONE SEEN
BILIRUBIN URINE: NEGATIVE
Glucose, UA: NEGATIVE mg/dL
Hgb urine dipstick: NEGATIVE
Ketones, ur: 5 mg/dL — AB
Nitrite: NEGATIVE
PROTEIN: NEGATIVE mg/dL
Specific Gravity, Urine: 1.019 (ref 1.005–1.030)
pH: 5 (ref 5.0–8.0)

## 2018-05-04 LAB — POC URINE PREG, ED: PREG TEST UR: NEGATIVE

## 2018-05-04 NOTE — ED Triage Notes (Signed)
Pt c/o UTI symptoms & bilat flank pain x 3 weeks.

## 2018-05-05 ENCOUNTER — Emergency Department (HOSPITAL_COMMUNITY): Payer: Self-pay

## 2018-05-05 MED ORDER — SODIUM CHLORIDE 0.9 % IV BOLUS
500.0000 mL | Freq: Once | INTRAVENOUS | Status: AC
  Administered 2018-05-05: 500 mL via INTRAVENOUS

## 2018-05-05 MED ORDER — CEPHALEXIN 500 MG PO CAPS: 500.0000 mg | ORAL_CAPSULE | Freq: Three times a day (TID) | ORAL | 0 refills | Status: DC

## 2018-05-05 MED ORDER — ONDANSETRON HCL 4 MG/2ML IJ SOLN
4.0000 mg | Freq: Once | INTRAMUSCULAR | Status: AC
  Administered 2018-05-05: 4 mg via INTRAVENOUS
  Filled 2018-05-05: qty 2

## 2018-05-05 MED ORDER — PHENAZOPYRIDINE HCL 200 MG PO TABS: 200.0000 mg | ORAL_TABLET | Freq: Three times a day (TID) | ORAL | 0 refills | Status: DC

## 2018-05-05 MED ORDER — KETOROLAC TROMETHAMINE 30 MG/ML IJ SOLN
30.0000 mg | Freq: Once | INTRAMUSCULAR | Status: AC
  Administered 2018-05-05: 30 mg via INTRAVENOUS
  Filled 2018-05-05: qty 1

## 2018-05-05 MED ORDER — PHENAZOPYRIDINE HCL 200 MG PO TABS
200.0000 mg | ORAL_TABLET | Freq: Three times a day (TID) | ORAL | Status: DC
  Administered 2018-05-05: 200 mg via ORAL
  Filled 2018-05-05: qty 1

## 2018-05-05 MED ORDER — SODIUM CHLORIDE 0.9 % IV BOLUS
1000.0000 mL | Freq: Once | INTRAVENOUS | Status: AC
  Administered 2018-05-05: 1000 mL via INTRAVENOUS

## 2018-05-05 MED ORDER — CEPHALEXIN 500 MG PO CAPS
500.0000 mg | ORAL_CAPSULE | Freq: Once | ORAL | Status: AC
  Administered 2018-05-05: 500 mg via ORAL
  Filled 2018-05-05: qty 1

## 2018-05-05 NOTE — ED Provider Notes (Addendum)
[place] [facility] DEPT Provider Note   CSN: [id number] Arrival date & time: 05/04/18  2248  Time seen 01:00 AM   History   Chief Complaint Chief Complaint  Patient presents with  . Dysuria  . Flank Pain    HPI Gloria Ramos is a 42 y.o. female.  HPI patient states 3 weeks ago she started having dysuria, with a pulsing discomfort after she urinates, frequency, urgency, with incontinence if she does not go to the bathroom right away.  She describes strong odor in her urine.  She states she had fever up to 101.9 a few days ago.  She has had some chills.  She complains of bilateral flank pain and bilateral lower pelvic pain.  Her left flank pain is worse than the right.  She states she has had this before if UTIs or kidney infections.  She states she was admitted once when she was dehydrated.  She denies any history of renal stones.  She denies being sexually active.  She denies nausea or vomiting.  Patient states she had a MI on April 4, she states EMS came out and to the paramedics told her she had a MI however she refused to go to the hospital or get any treatment.  PCP Patient, No Pcp Per   Past Medical History:  Diagnosis Date  . Anemia   . Arthritis   . Cervical cancer ([place])   . Colitis   . Collapsed lung   . Crohn's disease ([place])   . Edema   . Rheumatoid arthritis [facility])     Patient Active Problem List   Diagnosis Date Noted  . Spontaneous pneumothorax 09/30/2014  . Cervical dysplasia 09/30/2014  . Pelvic pain in female 09/30/2014  . Decreased hearing 09/30/2014    Past Surgical History:  Procedure Laterality Date  . ABDOMINAL SURGERY    . CERVICAL CONE BIOPSY  2015  . TUBAL LIGATION       OB History    Gravida  5   Para  3   Term  3   Preterm      AB  2   Living  3     SAB  2   TAB      Ectopic      Multiple      Live Births               Home Medications    Denies taking any medications.  Prior to  Admission medications   Medication Sig Start Date End Date Taking? Authorizing Provider  cephALEXin (KEFLEX) 500 MG capsule Take 1 capsule (500 mg total) by mouth 3 (three) times daily. 05/05/18   Rolland Porter, MD  metroNIDAZOLE (FLAGYL) 500 MG tablet Take 1 tablet (500 mg total) by mouth 2 (two) times daily. 10/21/14   Britt Bottom, NP  Multiple Vitamins-Minerals (MULTIVITAMIN WITH MINERALS) tablet Take 1 tablet by mouth daily.    [provider]  naproxen sodium (ANAPROX) 220 MG tablet Take 440 mg by mouth 2 (two) times daily with a meal.    [provider]  ondansetron (ZOFRAN) 4 MG tablet Take 1 tablet (4 mg total) by mouth every 6 (six) hours. 10/21/14   Britt Bottom, NP  phenazopyridine (PYRIDIUM) 200 MG tablet Take 1 tablet (200 mg total) by mouth 3 (three) times daily. 05/05/18   Rolland Porter, MD  traMADol (ULTRAM) 50 MG tablet Take 1 tablet (50 mg total) by mouth every 6 (six) hours as needed.  10/21/14   Britt Bottom, NP    Family History Family History  Problem Relation Age of Onset  . Cancer Maternal Aunt        cervical    Social History Social History   Tobacco Use  . Smoking status: Current Every Day Smoker    Packs/day: 0.50    Years: 25.00    Pack years: 12.50  Substance Use Topics  . Alcohol use: Yes    Comment: rare  . Drug use: No  Patient applying for disability for cervical cancer, carpal tunnel syndrome, edema, anemia without requiring transfusions and Crohn's.   Allergies   Codeine; Food; Hydrocodone; Latex; and Oxycodone   Review of Systems Review of Systems  All other systems reviewed and are negative.    Physical Exam Updated Vital Signs BP (!) 155/96   Pulse 87   Temp 99.1 F (37.3 C) (Oral)   Resp 16   Ht 5\' 5"  (1.651 m)   Wt 97.1 kg   LMP 03/20/2018 (Approximate)   SpO2 97%   BMI 35.61 kg/m   Vital signs normal except low-grade temp   Physical Exam  Constitutional: She is oriented to person, place,  and time. She appears well-developed and well-nourished.  Non-toxic appearance. She does not appear ill. No distress.  HENT:  Head: Normocephalic and atraumatic.  Right Ear: External ear normal.  Left Ear: External ear normal.  Nose: Nose normal. No mucosal edema or rhinorrhea.  Mouth/Throat: Oropharynx is clear and moist and mucous membranes are normal. No dental abscesses or uvula swelling.  Eyes: Pupils are equal, round, and reactive to light. Conjunctivae and EOM are normal.  Neck: Normal range of motion and full passive range of motion without pain. Neck supple.  Cardiovascular: Normal rate, regular rhythm and normal heart sounds. Exam reveals no gallop and no friction rub.  No murmur heard. Pulmonary/Chest: Effort normal and breath sounds normal. No respiratory distress. She has no wheezes. She has no rhonchi. She has no rales. She exhibits no tenderness and no crepitus.  Abdominal: Soft. Normal appearance and bowel sounds are normal. She exhibits no distension. There is tenderness. There is no rebound and no guarding.    Bilateral CVA tenderness, left worse than the right.  Musculoskeletal: Normal range of motion. She exhibits no edema or tenderness.  Moves all extremities well.   Neurological: She is alert and oriented to person, place, and time. She has normal strength. No cranial nerve deficit.  Skin: Skin is warm, dry and intact. No rash noted. No erythema. No pallor.  Psychiatric: She has a normal mood and affect. Her speech is normal and behavior is normal. Her mood appears not anxious.  Nursing note and vitals reviewed.    ED Treatments / Results  Labs (all labs ordered are listed, but only abnormal results are displayed) Results for orders placed or performed during the hospital encounter of 05/04/18  Urinalysis, Routine w reflex microscopic- may I&O cath if menses  Result Value Ref Range   Color, Urine YELLOW YELLOW   APPearance HAZY (A) CLEAR   Specific Gravity,  Urine 1.019 1.005 - 1.030   pH 5.0 5.0 - 8.0   Glucose, UA NEGATIVE NEGATIVE mg/dL   Hgb urine dipstick NEGATIVE NEGATIVE   Bilirubin Urine NEGATIVE NEGATIVE   Ketones, ur 5 (A) NEGATIVE mg/dL   Protein, ur NEGATIVE NEGATIVE mg/dL   Nitrite NEGATIVE NEGATIVE   Leukocytes, UA SMALL (A) NEGATIVE   RBC / HPF 0-5 0 - 5 RBC/hpf  WBC, UA >50 (H) 0 - 5 WBC/hpf   Bacteria, UA NONE SEEN NONE SEEN   Squamous Epithelial / LPF 6-10 0 - 5   Mucus PRESENT   POC Urine Pregnancy, ED (do NOT order at [facility])  Result Value Ref Range   Preg Test, Ur NEGATIVE NEGATIVE    Laboratory interpretation all normal except questionable UTI, nitrate negative   EKG None  Radiology Ct Renal [place] Study  Result Date: 05/05/2018 CLINICAL DATA:  UTI symptoms. Bilateral flank pain for 3 weeks. Dysuria. Fever. EXAM: CT ABDOMEN AND PELVIS WITHOUT CONTRAST TECHNIQUE: Multidetector CT imaging of the abdomen and pelvis was performed following the standard protocol without IV contrast. COMPARISON:  10/21/2014 pelvic ultrasound. Most recent CT 10/21/2014. FINDINGS: Lower chest: Minimal motion degradation in the lower chest. Left hemidiaphragm elevation. Subsegmental atelectasis at the left lung base. Normal heart size without pericardial or pleural effusion. Hepatobiliary: High right hepatic lobe cyst. Normal gallbladder, without biliary ductal dilatation. Pancreas: Normal, without mass or ductal dilatation. Spleen: Normal in size, without focal abnormality. Adrenals/Urinary Tract: Normal adrenal glands. No renal calculi or hydronephrosis. No hydroureter or ureteric calculi. No bladder calculi. Stomach/Bowel: Normal stomach, without wall thickening. Colonic stool burden suggests constipation. Normal terminal ileum and appendix. Normal small bowel. Vascular/Lymphatic: Normal caliber of the aorta and branch vessels. No abdominopelvic adenopathy. Reproductive: Normal uterus and adnexa. Other: No significant free fluid. Tiny  periumbilical fat containing ventral abdominal wall hernia. Musculoskeletal: No acute osseous abnormality. IMPRESSION: 1.  No urinary tract calculi or hydronephrosis. 2.  Possible constipation. 3. Normal appendix. Electronically Signed   By: Abigail Miyamoto M.D.   On: 05/05/2018 01:53    Procedures Procedures (including critical care time)  Medications Ordered in ED Medications  sodium chloride 0.9 % bolus 500 mL (500 mLs Intravenous New Bag/Given 05/05/18 0244)  phenazopyridine (PYRIDIUM) tablet 200 mg (has no administration in time range)  sodium chloride 0.9 % bolus 1,000 mL (1,000 mLs Intravenous New Bag/Given 05/05/18 0145)  ondansetron (ZOFRAN) injection 4 mg (4 mg Intravenous Given 05/05/18 0147)  ketorolac (TORADOL) 30 MG/ML injection 30 mg (30 mg Intravenous Given 05/05/18 0149)  cephALEXin (KEFLEX) capsule 500 mg (500 mg Oral Given 05/05/18 0241)     Initial Impression / Assessment and Plan / ED Course  I have reviewed the triage vital signs and the nursing notes.  Pertinent labs & imaging results that were available during my care of the patient were reviewed by me and considered in my medical decision making (see chart for details).  When I review her prior studies she has had 6 CT scans of the abdomen and pelvis since 2003, the last was in 2016.    Patient was given IV fluids, IV Toradol for pain and IV Zofran.  After reviewing her CT scan she was given oral Keflex.  Patient has no evidence of pyelonephritis on her CT scan.  2:30 AM patient was given the results of her CT scan.  She is getting her IV fluids.  She was started on oral Keflex.  She was discharged home with antibiotics and pyridium for her dysuria.   Final Clinical Impressions(s) / ED Diagnoses   Final diagnoses:  Urinary tract infection without hematuria, site unspecified  Acute flank pain    ED Discharge Orders         Ordered    cephALEXin (KEFLEX) 500 MG capsule  3 times daily,   Status:  Discontinued      05/05/18 0339    phenazopyridine (  PYRIDIUM) 200 MG tablet  3 times daily     05/05/18 0340    cephALEXin (KEFLEX) 500 MG capsule  3 times daily     05/05/18 0340         Plan discharge  Rolland Porter, MD, Barbette Or, MD 05/05/18 [place], [place], MD 05/05/18 [phone]

## 2018-05-05 NOTE — Discharge Instructions (Addendum)
Drink plenty of fluids. Take the antibiotics until gone. Use the pyridium for discomfort on urinating, you can also use AZO OTC.  You can take ibuprofen 600 mg plus acetaminophen 650 mg every 6 hours as needed for pain.  Recheck if you get a fever, have uncontrolled vomiting or seem worse.

## 2018-05-05 NOTE — ED Notes (Signed)
Patient transported to X-ray 

## 2018-05-10 LAB — CULTURE, BLOOD (ROUTINE X 2)
Culture: NO GROWTH
Culture: NO GROWTH
Special Requests: ADEQUATE
Special Requests: ADEQUATE

## 2020-09-05 IMAGING — CT CT RENAL STONE PROTOCOL
2 of 4 series · 16 of 46 positions shown, 18 images · non-contrast
Comparison: 10/21/2014 pelvic ultrasound. Most recent CT
10/21/2014.

CLINICAL DATA: UTI symptoms. Bilateral flank pain for 3 weeks.
Dysuria. Fever.

EXAM:
CT ABDOMEN AND PELVIS WITHOUT CONTRAST
TECHNIQUE: Multidetector CT imaging of the abdomen and pelvis was performed
following the standard protocol without IV contrast.

[Series 2: axial st · axial · 0.70mm/px · z∈[+770,+1206]mm · 13 of 99 slices shown, 15 images]
[im 6/99  soft-tissue]
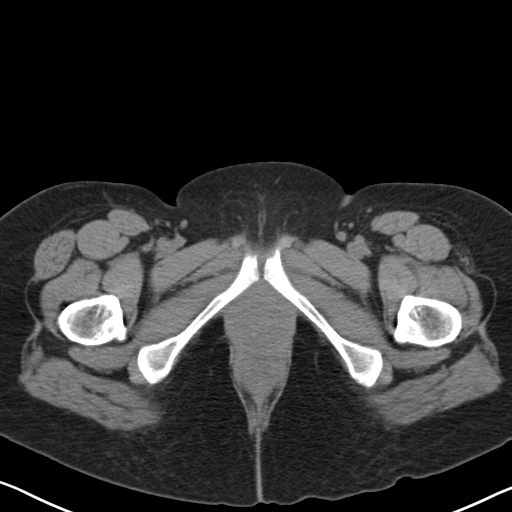
[im 6/99  bone]
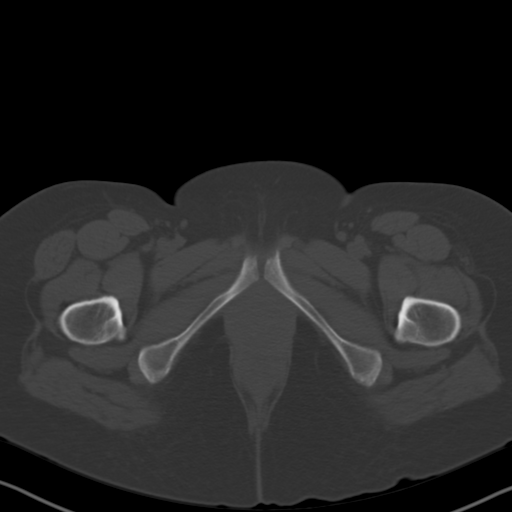
[im 16/99  soft-tissue]
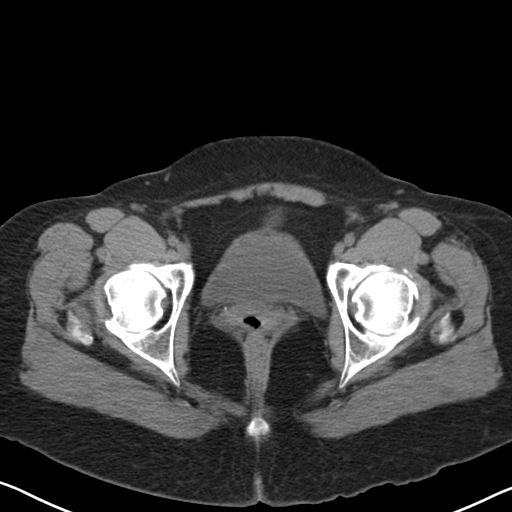
[im 21/99  soft-tissue]
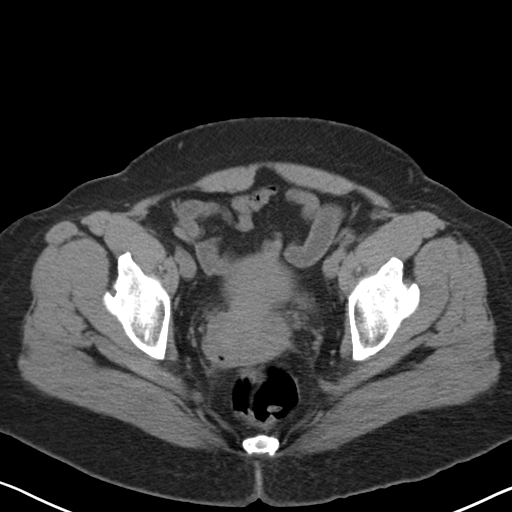
[im 26/99  soft-tissue]
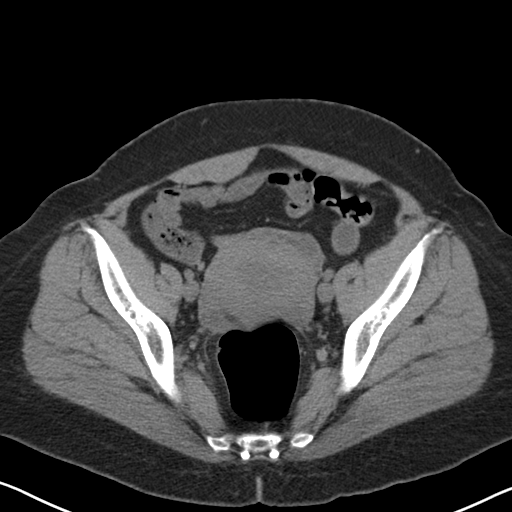
[im 37/99  soft-tissue]
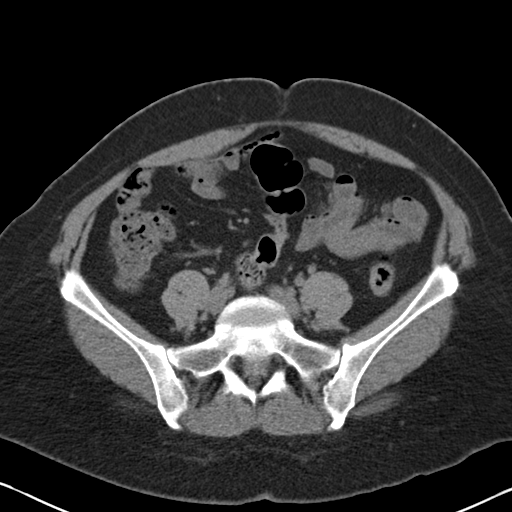
[im 42/99  soft-tissue]
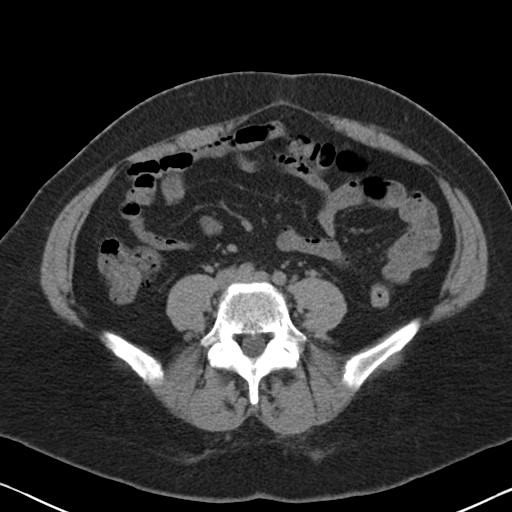
[im 52/99  soft-tissue]
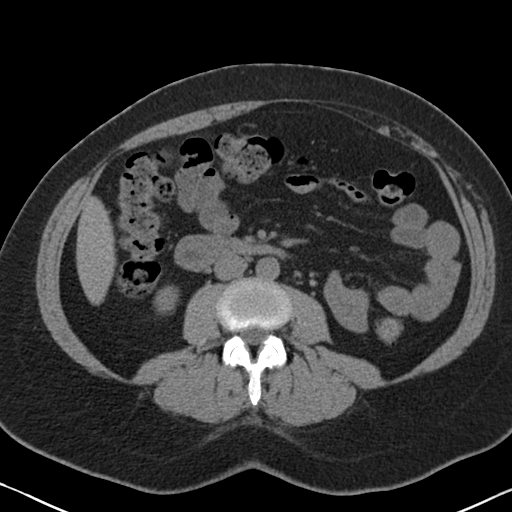
[im 57/99  soft-tissue]
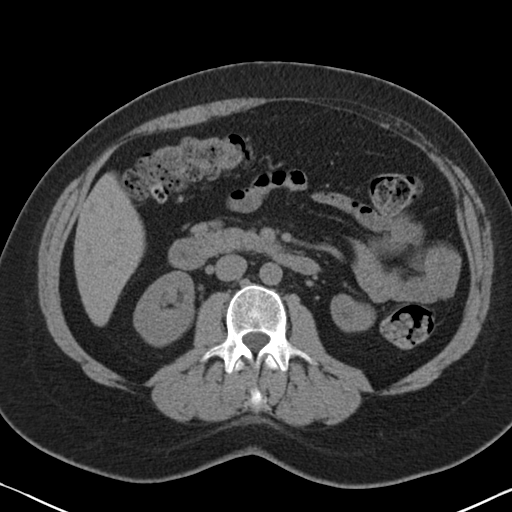
[im 62/99  soft-tissue]
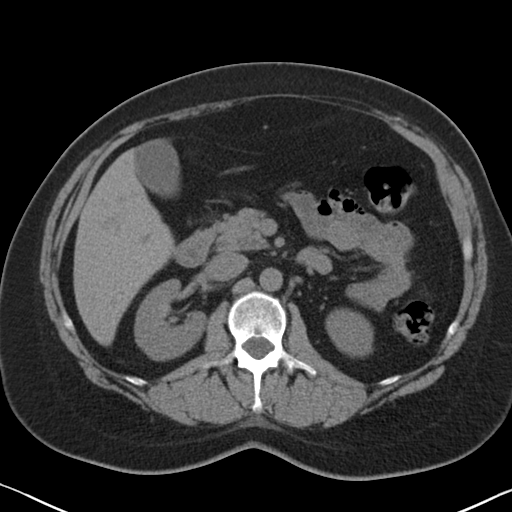
[im 62/99  bone]
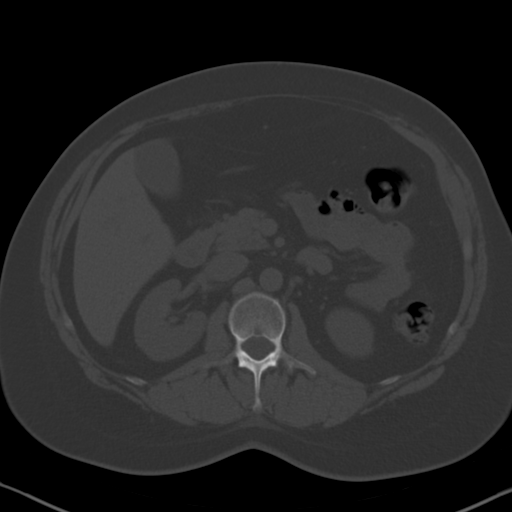
[im 73/99  soft-tissue]
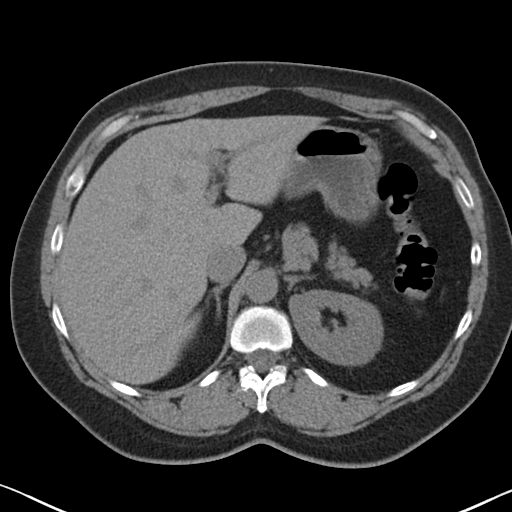
[im 78/99  soft-tissue]
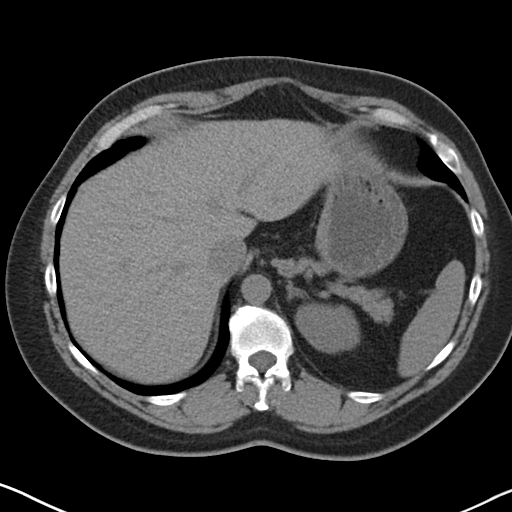
[im 83/99  soft-tissue]
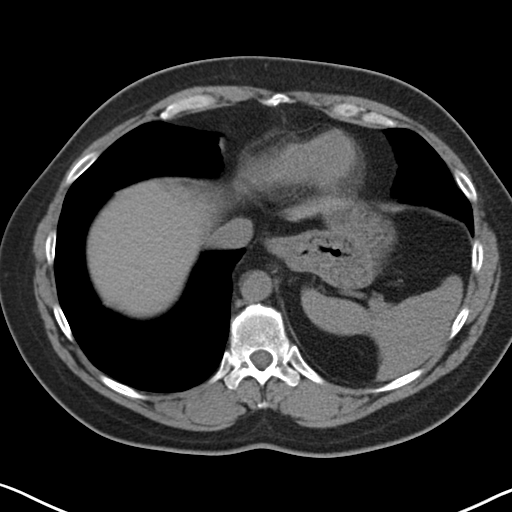
[im 93/99  soft-tissue]
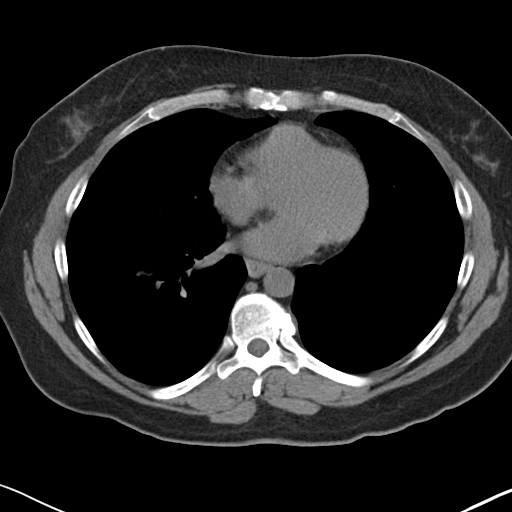

[Series 5: coronal · coronal · 0.80mm/px · 3 of 162 slices shown]
[im 54/162  soft-tissue]
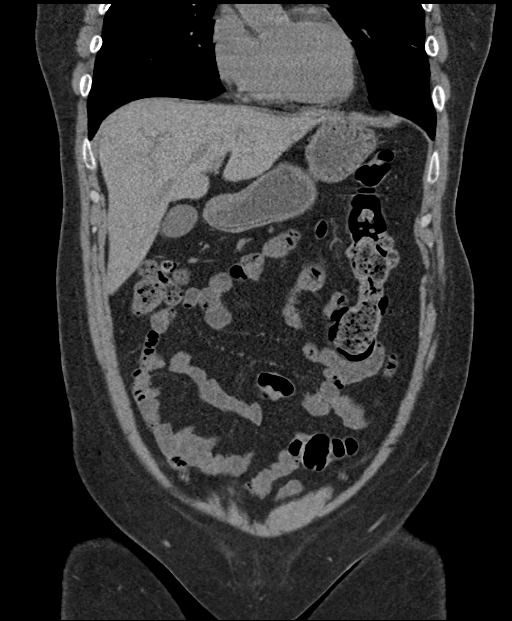
[im 72/162  soft-tissue]
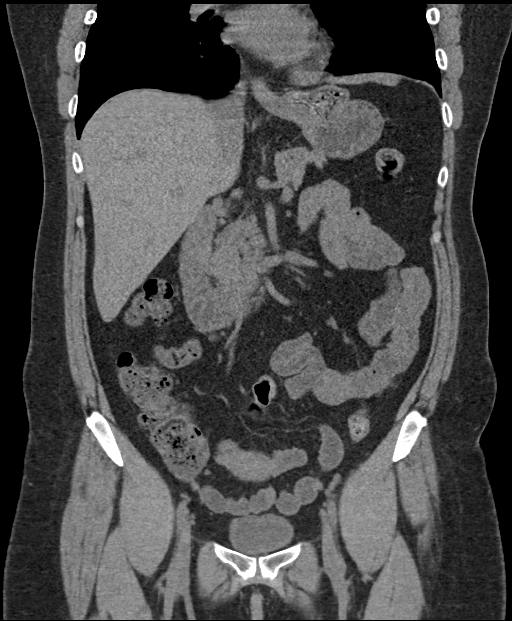
[im 90/162  soft-tissue]
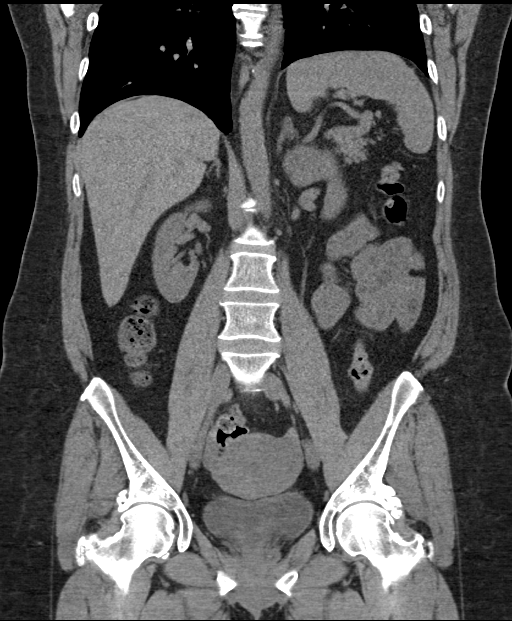

[16 of 46 positions shown; findings below may reference images not displayed]

FINDINGS: Lower chest: Minimal motion degradation in the lower chest. Left
hemidiaphragm elevation. Subsegmental atelectasis at the left lung
base. Normal heart size without pericardial or pleural effusion.

Hepatobiliary: High right hepatic lobe cyst. Normal gallbladder,
without biliary ductal dilatation.

Pancreas: Normal, without mass or ductal dilatation.

Spleen: Normal in size, without focal abnormality.

Adrenals/Urinary Tract: Normal adrenal glands. No renal calculi or
hydronephrosis. No hydroureter or ureteric calculi. No bladder
calculi.

Stomach/Bowel: Normal stomach, without wall thickening. Colonic
stool burden suggests constipation. Normal terminal ileum and
appendix. Normal small bowel.

Vascular/Lymphatic: Normal caliber of the aorta and branch vessels.
No abdominopelvic adenopathy.

Reproductive: Normal uterus and adnexa.

Other: No significant free fluid. Tiny periumbilical fat containing
ventral abdominal wall hernia.

Musculoskeletal: No acute osseous abnormality.
IMPRESSION: 1.  No urinary tract calculi or hydronephrosis.
2.  Possible constipation.
3. Normal appendix.

## 2022-05-09 ENCOUNTER — Other Ambulatory Visit: Payer: Self-pay

## 2022-05-09 ENCOUNTER — Emergency Department (HOSPITAL_COMMUNITY)
Admission: EM | Admit: 2022-05-09 | Discharge: 2022-05-09 | Disposition: A | Discharge status: Home / Self Care | Payer: Self-pay | Attending: Emergency Medicine | Admitting: Emergency Medicine

## 2022-05-09 ENCOUNTER — Encounter (HOSPITAL_COMMUNITY): Payer: Self-pay

## 2022-05-09 DIAGNOSIS — R8289 Other abnormal findings on cytological and histological examination of urine: Secondary | ICD-10-CM | POA: Insufficient documentation

## 2022-05-09 DIAGNOSIS — R509 Fever, unspecified: Secondary | ICD-10-CM | POA: Insufficient documentation

## 2022-05-09 DIAGNOSIS — Z91040 Latex allergy status: Secondary | ICD-10-CM | POA: Insufficient documentation

## 2022-05-09 DIAGNOSIS — R300 Dysuria: Secondary | ICD-10-CM | POA: Insufficient documentation

## 2022-05-09 DIAGNOSIS — R109 Unspecified abdominal pain: Secondary | ICD-10-CM | POA: Insufficient documentation

## 2022-05-09 DIAGNOSIS — Z8541 Personal history of malignant neoplasm of cervix uteri: Secondary | ICD-10-CM | POA: Insufficient documentation

## 2022-05-09 LAB — URINALYSIS, ROUTINE W REFLEX MICROSCOPIC
Bilirubin Urine: NEGATIVE
Glucose, UA: NEGATIVE mg/dL
Hgb urine dipstick: NEGATIVE
Ketones, ur: NEGATIVE mg/dL
Nitrite: NEGATIVE
Protein, ur: 30 mg/dL — AB
Specific Gravity, Urine: 1.015 (ref 1.005–1.030)
WBC, UA: >50 WBC/hpf — ABNORMAL HIGH (ref 0–5)
pH: 5 (ref 5.0–8.0)

## 2022-05-09 LAB — PREGNANCY, URINE: Preg Test, Ur: NEGATIVE

## 2022-05-09 MED ORDER — PHENAZOPYRIDINE HCL 200 MG PO TABS: 200.0000 mg | ORAL_TABLET | Freq: Three times a day (TID) | ORAL | 0 refills | Status: DC

## 2022-05-09 MED ORDER — SODIUM CHLORIDE 0.9 % IV BOLUS
1000.0000 mL | Freq: Once | INTRAVENOUS | Status: AC
  Administered 2022-05-09: 1000 mL via INTRAVENOUS

## 2022-05-09 MED ORDER — KETOROLAC TROMETHAMINE 15 MG/ML IJ SOLN
15.0000 mg | Freq: Once | INTRAMUSCULAR | Status: AC
  Administered 2022-05-09: 15 mg via INTRAVENOUS
  Filled 2022-05-09: qty 1

## 2022-05-09 MED ORDER — CEFADROXIL 500 MG PO CAPS: 500.0000 mg | ORAL_CAPSULE | Freq: Two times a day (BID) | ORAL | 0 refills | Status: DC

## 2022-05-09 MED ORDER — PHENAZOPYRIDINE HCL 200 MG PO TABS
200.0000 mg | ORAL_TABLET | Freq: Once | ORAL | Status: AC
  Administered 2022-05-09: 200 mg via ORAL
  Filled 2022-05-09: qty 1

## 2022-05-09 NOTE — ED Triage Notes (Signed)
Patient reports that she has had dysuria x one year. Patient states "I've been trying to take care of it myself."

## 2022-05-09 NOTE — Discharge Instructions (Addendum)
Please return to the ED with any new symptoms such as continued fevers despite medication, increased flank pain, continued burning with urination Please begin taking Duricef.  You will take this medication twice daily for the next 7 days. Please begin taking Pyridium.  This will allow for symptomatic relief of your burning with urination.  You will take this for the next 2 days. Please read attached guide concerning dysuria Please utilize provided coupons

## 2022-05-09 NOTE — ED Provider Notes (Signed)
[place] DEPT Provider Note   CSN: [id number] Arrival date & time: 05/09/22  3016     History  Chief Complaint  Patient presents with   Dysuria    Gloria Ramos is a 46 y.o. female with medical history of anemia, arthritis, cervical cancer, Crohn's disease, RA.  Patient presents to ED for evaluation of dysuria.  Patient reports that dysuria is been ongoing for the last 1 year.  The patient reports that she is currently homeless and has been attempting to control the symptoms utilizing Azo, grape juice and increased fluids.  Patient reports that she has had multiple UTIs in the past and this sensation she is describing and feeling currently feels as if it is a UTI.  Patient states that she has had an on and off fever for the last "couple of weeks" which she has been controlling with Tylenol.  Patient is endorsing dysuria, bilateral flank pain, fevers, body aches and chills.  Patient denies any nausea, vomiting, pelvic pain, vaginal discharge.   Dysuria Associated symptoms: fever and flank pain   Associated symptoms: no nausea, no vaginal discharge and no vomiting        Home Medications Prior to Admission medications   Medication Sig Start Date End Date Taking? Authorizing Provider  cefadroxil (DURICEF) 500 MG capsule Take 1 capsule (500 mg total) by mouth 2 (two) times daily. 05/09/22  Yes Azucena Cecil, PA-C  cephALEXin (KEFLEX) 500 MG capsule Take 1 capsule (500 mg total) by mouth 3 (three) times daily. 05/05/18   Rolland Porter, MD  metroNIDAZOLE (FLAGYL) 500 MG tablet Take 1 tablet (500 mg total) by mouth 2 (two) times daily. 10/21/14   Britt Bottom, NP  Multiple Vitamins-Minerals (MULTIVITAMIN WITH MINERALS) tablet Take 1 tablet by mouth daily.    [provider]  naproxen sodium (ANAPROX) 220 MG tablet Take 440 mg by mouth 2 (two) times daily with a meal.    [provider]  ondansetron (ZOFRAN) 4 MG tablet Take 1 tablet  (4 mg total) by mouth every 6 (six) hours. 10/21/14   Britt Bottom, NP  phenazopyridine (PYRIDIUM) 200 MG tablet Take 1 tablet (200 mg total) by mouth 3 (three) times daily. 05/09/22   Azucena Cecil, PA-C  traMADol (ULTRAM) 50 MG tablet Take 1 tablet (50 mg total) by mouth every 6 (six) hours as needed. 10/21/14   Britt Bottom, NP      Allergies    Codeine, Food, Hydrocodone, Latex, and Oxycodone    Review of Systems   Review of Systems  Constitutional:  Positive for chills and fever.  Gastrointestinal:  Negative for nausea and vomiting.  Genitourinary:  Positive for dysuria and flank pain. Negative for pelvic pain and vaginal discharge.    Physical Exam Updated Vital Signs BP 112/73   Pulse (!) 58   Temp 98.2 F (36.8 C)   Resp 16   Ht '5\' 5"'$  (1.651 m)   Wt 81.6 kg   LMP 05/07/2022   SpO2 99%   BMI 29.95 kg/m  Physical Exam Vitals and nursing note reviewed.  Constitutional:      General: She is not in acute distress.    Appearance: Normal appearance. She is not ill-appearing, toxic-appearing or diaphoretic.  HENT:     Head: Normocephalic and atraumatic.     Nose: Nose normal.     Mouth/Throat:     Mouth: Mucous membranes are moist.     Pharynx: Oropharynx is clear.  Eyes:  Extraocular Movements: Extraocular movements intact.     Conjunctiva/sclera: Conjunctivae normal.     Pupils: Pupils are equal, round, and reactive to light.  Cardiovascular:     Rate and Rhythm: Normal rate and regular rhythm.  Pulmonary:     Effort: Pulmonary effort is normal.     Breath sounds: Normal breath sounds. No wheezing.  Abdominal:     General: Abdomen is flat. Bowel sounds are normal.     Palpations: Abdomen is soft.     Tenderness: There is right CVA tenderness and left CVA tenderness.  Musculoskeletal:     Cervical back: Normal range of motion and neck supple. No tenderness.  Skin:    General: Skin is warm and dry.     Capillary Refill: Capillary refill takes  less than 2 seconds.  Neurological:     Mental Status: She is alert and oriented to person, place, and time.     ED Results / Procedures / Treatments   Labs (all labs ordered are listed, but only abnormal results are displayed) Labs Reviewed  URINALYSIS, ROUTINE W REFLEX MICROSCOPIC - Abnormal; Notable for the following components:      Result Value   APPearance CLOUDY (*)    Protein, ur 30 (*)    Leukocytes,Ua LARGE (*)    WBC, UA >50 (*)    Bacteria, UA RARE (*)    All other components within normal limits  URINE CULTURE  PREGNANCY, URINE    EKG None  Radiology No results found.  Procedures Procedures   Medications Ordered in ED Medications  sodium chloride 0.9 % bolus 1,000 mL (0 mLs Intravenous Stopped 05/09/22 1036)  ketorolac (TORADOL) 15 MG/ML injection 15 mg (15 mg Intravenous Given 05/09/22 0910)  phenazopyridine (PYRIDIUM) tablet 200 mg (200 mg Oral Given 05/09/22 0910)    ED Course/ Medical Decision Making/ A&P Clinical Course as of 05/09/22 1212  Tue May 09, 2022  1107 Finishing IV fluids [CG]    Clinical Course User Index [CG] Azucena Cecil, PA-C                           Medical Decision Making Amount and/or Complexity of Data Reviewed Labs: ordered.  Risk Prescription drug management.   46 year old female presents to ED for evaluation.  Please see HPI for further details.  On examination the patient is afebrile and nontachycardic.  The patient councilor bilaterally, she is not hypoxic.  The patient abdomen is soft and compressible throughout.  The patient has bilateral CVA tenderness.  Patient nontoxic in appearance.  Patient initially treated with 1 L normal saline, 50 mg Toradol, 200 mg Pyridium.  Patient will be worked up utilizing urinalysis as well as urine culture.  The patient urinalysis shows large leukocytes, greater than 50 white blood cells, bacteria.  The patient urine will be cultured.  Patient will be sent home on [place]  as well as Pyridium.  The patient will be advised to return to the ED with any new symptoms such as continued fevers despite antibiotics, increased pain, increased dysuria.  Patient voices understanding of my instructions.  The patient had all of her questions answered to her satisfaction prior to discharge.  The patient stable at this time for discharge home.  Final Clinical Impression(s) / ED Diagnoses Final diagnoses:  Dysuria    Rx / DC Orders ED Discharge Orders          Ordered    cefadroxil (DURICEF)  500 MG capsule  2 times daily        05/09/22 1207    phenazopyridine (PYRIDIUM) 200 MG tablet  3 times daily,   Status:  Discontinued        05/09/22 1207    phenazopyridine (PYRIDIUM) 200 MG tablet  3 times daily        05/09/22 1209              Azucena Cecil, PA-C 05/09/22 1212    Regan Lemming, MD 05/09/22 1654

## 2022-05-11 LAB — URINE CULTURE: Culture: >=100000 — AB

## 2022-05-12 ENCOUNTER — Telehealth (HOSPITAL_BASED_OUTPATIENT_CLINIC_OR_DEPARTMENT_OTHER): Payer: Self-pay

## 2022-05-12 NOTE — Telephone Encounter (Signed)
Post ED Visit - Positive Culture Follow-up  Culture report reviewed by antimicrobial stewardship pharmacist: [place] Team '[x]'$  Toma Aran, Pharm.D. '[]'$  Heide Guile, Pharm.D., BCPS AQ-ID '[]'$  Parks Neptune, Pharm.D., BCPS '[]'$  Alycia Rossetti, Pharm.D., BCPS '[]'$  [place], [place].D., BCPS, AAHIVP '[]'$  Legrand Como, Pharm.D., BCPS, AAHIVP '[]'$  Salome Arnt, PharmD, BCPS '[]'$  Johnnette Gourd, PharmD, BCPS '[]'$  Hughes Better, PharmD, BCPS '[]'$  Leeroy Cha, PharmD '[]'$  Laqueta Linden, PharmD, BCPS '[]'$  Albertina Parr, PharmD  [place] Team '[]'$  Leodis Sias, PharmD '[]'$  Lindell Spar, PharmD '[]'$  Royetta Asal, PharmD '[]'$  Graylin Shiver, Rph '[]'$  Rema Fendt) Glennon Mac, PharmD '[]'$  Arlyn Dunning, PharmD '[]'$  Netta Cedars, PharmD '[]'$  Dia Sitter, PharmD '[]'$  Leone Haven, PharmD '[]'$  Gretta Arab, PharmD '[]'$  Theodis Shove, PharmD '[]'$  Peggyann Juba, PharmD '[]'$  Reuel Boom, PharmD   Positive urine culture Treated with Cefadroxil, organism sensitive to the same and no further patient follow-up is required at this time.  Glennon Hamilton 05/12/2022, 11:57 AM

## 2022-08-20 ENCOUNTER — Emergency Department (HOSPITAL_COMMUNITY): Payer: Self-pay

## 2022-08-20 ENCOUNTER — Encounter (HOSPITAL_COMMUNITY): Payer: Self-pay

## 2022-08-20 ENCOUNTER — Emergency Department (HOSPITAL_COMMUNITY)
Admission: EM | Admit: 2022-08-20 | Discharge: 2022-08-20 | Disposition: A | Discharge status: Home / Self Care | Payer: Self-pay | Attending: Emergency Medicine | Admitting: Emergency Medicine

## 2022-08-20 DIAGNOSIS — Z91040 Latex allergy status: Secondary | ICD-10-CM | POA: Insufficient documentation

## 2022-08-20 DIAGNOSIS — Z1152 Encounter for screening for COVID-19: Secondary | ICD-10-CM | POA: Insufficient documentation

## 2022-08-20 DIAGNOSIS — J101 Influenza due to other identified influenza virus with other respiratory manifestations: Secondary | ICD-10-CM | POA: Insufficient documentation

## 2022-08-20 LAB — URINALYSIS, ROUTINE W REFLEX MICROSCOPIC
Bilirubin Urine: NEGATIVE
Glucose, UA: NEGATIVE mg/dL
Ketones, ur: NEGATIVE mg/dL
Leukocytes,Ua: NEGATIVE
Nitrite: NEGATIVE
Protein, ur: NEGATIVE mg/dL
Specific Gravity, Urine: 1.015 (ref 1.005–1.030)
pH: 6 (ref 5.0–8.0)

## 2022-08-20 LAB — CBC WITH DIFFERENTIAL/PLATELET
Abs Immature Granulocytes: 0.03 10*3/uL (ref 0.00–0.07)
Basophils Absolute: 0 10*3/uL (ref 0.0–0.1)
Basophils Relative: 0 %
Eosinophils Absolute: 0 10*3/uL (ref 0.0–0.5)
Eosinophils Relative: 0 %
HCT: 42.4 % (ref 36.0–46.0)
Hemoglobin: 13.4 g/dL (ref 12.0–15.0)
Immature Granulocytes: 1 %
Lymphocytes Relative: 21 %
Lymphs Abs: 1.2 10*3/uL (ref 0.7–4.0)
MCH: 24.8 pg — ABNORMAL LOW (ref 26.0–34.0)
MCHC: 31.6 g/dL (ref 30.0–36.0)
MCV: 78.4 fL — ABNORMAL LOW (ref 80.0–100.0)
Monocytes Absolute: 0.5 10*3/uL (ref 0.1–1.0)
Monocytes Relative: 9 %
Neutro Abs: 4 10*3/uL (ref 1.7–7.7)
Neutrophils Relative %: 69 %
Platelets: 191 10*3/uL (ref 150–400)
RBC: 5.41 MIL/uL — ABNORMAL HIGH (ref 3.87–5.11)
RDW: 18.2 % — ABNORMAL HIGH (ref 11.5–15.5)
WBC: 5.8 10*3/uL (ref 4.0–10.5)
nRBC: 0 % (ref 0.0–0.2)

## 2022-08-20 LAB — I-STAT BETA HCG BLOOD, ED (MC, WL, AP ONLY): I-stat hCG, quantitative: <5 m[IU]/mL (ref <5)

## 2022-08-20 LAB — COMPREHENSIVE METABOLIC PANEL
ALT: 20 U/L (ref 0–44)
AST: 27 U/L (ref 15–41)
Albumin: 3.7 g/dL (ref 3.5–5.0)
Alkaline Phosphatase: 63 U/L (ref 38–126)
Anion gap: 9 (ref 5–15)
BUN: 12 mg/dL (ref 6–20)
CO2: 21 mmol/L — ABNORMAL LOW (ref 22–32)
Calcium: 8.7 mg/dL — ABNORMAL LOW (ref 8.9–10.3)
Chloride: 107 mmol/L (ref 98–111)
Creatinine, Ser: 0.81 mg/dL (ref 0.44–1.00)
GFR, Estimated: >60 mL/min (ref >60)
Glucose, Bld: 112 mg/dL — ABNORMAL HIGH (ref 70–99)
Potassium: 3.6 mmol/L (ref 3.5–5.1)
Sodium: 137 mmol/L (ref 135–145)
Total Bilirubin: 0.3 mg/dL (ref 0.3–1.2)
Total Protein: 6.8 g/dL (ref 6.5–8.1)

## 2022-08-20 LAB — RESP PANEL BY RT-PCR (RSV, FLU A&B, COVID)  RVPGX2
Influenza A by PCR: POSITIVE — AB
Influenza B by PCR: NEGATIVE
Resp Syncytial Virus by PCR: NEGATIVE
SARS Coronavirus 2 by RT PCR: NEGATIVE

## 2022-08-20 LAB — TROPONIN I (HIGH SENSITIVITY)
Troponin I (High Sensitivity): 2 ng/L (ref <18)
Troponin I (High Sensitivity): 3 ng/L (ref <18)

## 2022-08-20 MED ORDER — ONDANSETRON HCL 4 MG/2ML IJ SOLN
4.0000 mg | Freq: Once | INTRAMUSCULAR | Status: AC
  Administered 2022-08-20: 4 mg via INTRAVENOUS
  Filled 2022-08-20: qty 2

## 2022-08-20 MED ORDER — ONDANSETRON 4 MG PO TBDP: 4.0000 mg | ORAL_TABLET | Freq: Three times a day (TID) | ORAL | 0 refills | Status: DC | PRN

## 2022-08-20 MED ORDER — OSELTAMIVIR PHOSPHATE 75 MG PO CAPS: 75.0000 mg | ORAL_CAPSULE | Freq: Two times a day (BID) | ORAL | 0 refills | Status: DC

## 2022-08-20 MED ORDER — LACTATED RINGERS IV BOLUS
1000.0000 mL | Freq: Once | INTRAVENOUS | Status: AC
  Administered 2022-08-20: 1000 mL via INTRAVENOUS

## 2022-08-20 MED ORDER — KETOROLAC TROMETHAMINE 15 MG/ML IJ SOLN
15.0000 mg | Freq: Once | INTRAMUSCULAR | Status: AC
  Administered 2022-08-20: 15 mg via INTRAVENOUS
  Filled 2022-08-20: qty 1

## 2022-08-20 NOTE — ED Notes (Signed)
Patient transported to CT 

## 2022-08-20 NOTE — ED Triage Notes (Signed)
Pt arrived via POV, c/o chest pain, SOB, dizziness, bodyaches since christmas.

## 2022-08-20 NOTE — ED Provider Triage Note (Signed)
Emergency Medicine Provider Triage Evaluation Note  Gloria Ramos , a 46 y.o. female  was evaluated in triage.  Pt complains of chest pain, and concern for kidney infection. Denies recent travel or prior history of dvt or pe.  Review of Systems  Positive: As above Negative: As above  Physical Exam  BP 134/86 (BP Location: Right Arm)   Pulse 100   Temp 98.6 F (37 C) (Oral)   Resp 17   SpO2 97%  Gen:   Awake, no distress   Resp:  Normal effort  MSK:   Moves extremities without difficulty  Other:    Medical Decision Making  Medically screening exam initiated at 10:20 AM.  Appropriate orders placed.  Gloria Ramos was informed that the remainder of the evaluation will be completed by another provider, this initial triage assessment does not replace that evaluation, and the importance of remaining in the ED until their evaluation is complete.     Evlyn Courier, PA-C 08/20/22 1021

## 2022-08-20 NOTE — ED Notes (Signed)
Unsuccessful IV attempt x2.  

## 2022-08-20 NOTE — ED Provider Notes (Signed)
Care handoff from [facility], PA-C at shift change. Please see their note for further information.   HPI:   46 year old female presents today for evaluation of generalized bodyaches, chest pain, cough that has been ongoing for several days. She also states that she had a passing out episode after standing up earlier. She denies any significant recent travel, prior history of DVT, PE, leg pain, or leg swelling. Denies recent surgery. Endorses some nausea but denies any vomiting. Decreased hydration. Endorses some right-sided flank pain. Endorses recent kidney infection. Currently denies dysuria, hematuria, urinary frequency.   Plan: Patient found to be Flu +, likely etiology of symptoms. Awaiting results of CT renal Treml study given her right sided flank pain and then likely d/c.  CT renal resulted and reveals  1. Nodular consolidation with internal air bronchograms in the paramedian bilateral lower lobes with additional patchy nodular opacities bilaterally. Findings are compatible with an infectious or inflammatory process. 2. No acute abnormality in the abdomen or pelvis. 3. Colonic diverticulosis without findings of acute diverticulitis.  I have personally reviewed and interpreted this imaging and agree with radiology interpretation.  I have discussed with the patient no clear etiology for patient's pain.  Likely MSK in nature.  Patient was concerned for kidney infection, UA reviewed and noninfectious appearing.  States that she feels stable to be discharged per previous providers plan.  Sent for Tamiflu and Zofran for symptomatic relief.  Patient is understanding amenable with plan, educated on red flag symptoms that would prompt immediate return.  Patient discharged in stable condition.   Nestor Lewandowsky 08/20/22 1942    Regan Lemming, MD 08/20/22 2136

## 2022-08-20 NOTE — ED Provider Notes (Signed)
[facility] DEPT Provider Note   CSN: [id number] Arrival date & time: 08/20/22  8841     History  Chief Complaint  Patient presents with   Chest Pain    Gloria Ramos is a 46 y.o. female.  46 year old female presents today for evaluation of generalized bodyaches, chest pain, cough that has been ongoing for several days.  She also states that she had a passing out episode after standing up earlier.  She denies any significant recent travel, prior history of DVT, PE, leg pain, or leg swelling.  Denies recent surgery.  Endorses some nausea but denies any vomiting.  Decreased hydration.  Endorses some right-sided flank pain.  Endorses recent kidney infection.  Currently denies dysuria, hematuria, urinary frequency.  The history is provided by the patient. No language interpreter was used.       Home Medications Prior to Admission medications   Medication Sig Start Date End Date Taking? Authorizing Provider  cefadroxil (DURICEF) 500 MG capsule Take 1 capsule (500 mg total) by mouth 2 (two) times daily. 05/09/22   Azucena Cecil, PA-C  cephALEXin (KEFLEX) 500 MG capsule Take 1 capsule (500 mg total) by mouth 3 (three) times daily. 05/05/18   Rolland Porter, MD  metroNIDAZOLE (FLAGYL) 500 MG tablet Take 1 tablet (500 mg total) by mouth 2 (two) times daily. 10/21/14   Britt Bottom, NP  Multiple Vitamins-Minerals (MULTIVITAMIN WITH MINERALS) tablet Take 1 tablet by mouth daily.    [provider]  naproxen sodium (ANAPROX) 220 MG tablet Take 440 mg by mouth 2 (two) times daily with a meal.    [provider]  ondansetron (ZOFRAN) 4 MG tablet Take 1 tablet (4 mg total) by mouth every 6 (six) hours. 10/21/14   Britt Bottom, NP  phenazopyridine (PYRIDIUM) 200 MG tablet Take 1 tablet (200 mg total) by mouth 3 (three) times daily. 05/09/22   Azucena Cecil, PA-C  traMADol (ULTRAM) 50 MG tablet Take 1 tablet (50 mg total) by mouth  every 6 (six) hours as needed. 10/21/14   Britt Bottom, NP      Allergies    Codeine, Food, Hydrocodone, Latex, and Oxycodone    Review of Systems   Review of Systems  Constitutional:  Negative for chills and fever.  HENT:  Positive for congestion.   Respiratory:  Positive for cough. Negative for shortness of breath.   Cardiovascular:  Positive for chest pain. Negative for palpitations and leg swelling.  Gastrointestinal:  Positive for nausea. Negative for abdominal pain and vomiting.  Neurological:  Positive for syncope and light-headedness. Negative for headaches.  All other systems reviewed and are negative.   Physical Exam Updated Vital Signs BP 100/63   Pulse 67   Temp 98.6 F (37 C) (Oral)   Resp 18   SpO2 97%  Physical Exam Vitals and nursing note reviewed.  Constitutional:      General: She is not in acute distress.    Appearance: Normal appearance. She is not ill-appearing.  HENT:     Head: Normocephalic and atraumatic.     Nose: Nose normal.  Eyes:     General: No scleral icterus.    Extraocular Movements: Extraocular movements intact.     Conjunctiva/sclera: Conjunctivae normal.  Cardiovascular:     Rate and Rhythm: Normal rate and regular rhythm.     Pulses: Normal pulses.  Pulmonary:     Effort: Pulmonary effort is normal. No respiratory distress.     Breath sounds:  Normal breath sounds. No wheezing or rales.  Abdominal:     General: There is no distension.     Palpations: Abdomen is soft.     Tenderness: There is no abdominal tenderness. There is no guarding.  Musculoskeletal:        General: Normal range of motion.     Cervical back: Normal range of motion.     Right lower leg: No edema.     Left lower leg: No edema.  Skin:    General: Skin is warm and dry.  Neurological:     General: No focal deficit present.     Mental Status: She is alert and oriented to person, place, and time. Mental status is at baseline.     ED Results /  Procedures / Treatments   Labs (all labs ordered are listed, but only abnormal results are displayed) Labs Reviewed  RESP PANEL BY RT-PCR (RSV, FLU A&B, COVID)  RVPGX2 - Abnormal; Notable for the following components:      Result Value   Influenza A by PCR POSITIVE (*)    All other components within normal limits  CBC WITH DIFFERENTIAL/PLATELET - Abnormal; Notable for the following components:   RBC 5.41 (*)    MCV 78.4 (*)    MCH 24.8 (*)    RDW 18.2 (*)    All other components within normal limits  COMPREHENSIVE METABOLIC PANEL - Abnormal; Notable for the following components:   CO2 21 (*)    Glucose, Bld 112 (*)    Calcium 8.7 (*)    All other components within normal limits  URINALYSIS, ROUTINE W REFLEX MICROSCOPIC - Abnormal; Notable for the following components:   APPearance CLOUDY (*)    Hgb urine dipstick SMALL (*)    Bacteria, UA RARE (*)    All other components within normal limits  TROPONIN I (HIGH SENSITIVITY)  TROPONIN I (HIGH SENSITIVITY)    EKG EKG Interpretation  Date/Time:  Sunday August 20 2022 09:58:14 EST Ventricular Rate:  95 PR Interval:  109 QRS Duration: 82 QT Interval:  346 QTC Calculation: 435 R Axis:   71 Text Interpretation: Sinus rhythm Atrial premature complex Short PR interval when compared top rior, similar apperaance. No STEMI Confirmed by Antony Blackbird [phone]) on 08/20/2022 11:18:28 AM  Radiology DG Chest 2 View  Result Date: 08/20/2022 CLINICAL DATA:  Chest pain EXAM: CHEST - 2 VIEW COMPARISON:  10/21/2014 FINDINGS: The heart size and mediastinal contours are within normal limits. Chronic elevation of the left hemidiaphragm. No focal airspace consolidation, pleural effusion, or pneumothorax. Scoliotic spinal curvature. IMPRESSION: No active cardiopulmonary disease. Electronically Signed   By: Davina Poke D.O.   On: 08/20/2022 10:51    Procedures Procedures    Medications Ordered in ED Medications  ketorolac (TORADOL) 15  MG/ML injection 15 mg (has no administration in time range)  lactated ringers bolus 1,000 mL (has no administration in time range)  ondansetron (ZOFRAN) injection 4 mg (has no administration in time range)    ED Course/ Medical Decision Making/ A&P Clinical Course as of 08/20/22 1709  Sun Aug 20, 2022  1645 I-stat hCG, quantitative: <5.0 [AA]    Clinical Course User Index [AA] Evlyn Courier, PA-C                           Medical Decision Making Amount and/or Complexity of Data Reviewed Labs: ordered. Decision-making details documented in ED Course. Radiology: ordered.  Risk Prescription  drug management.   Medical Decision Making / ED Course   This patient presents to the ED for concern of chest pain, shortness of breath, flank pain, this involves an extensive number of treatment options, and is a complaint that carries with it a high risk of complications and morbidity.  The differential diagnosis includes ACS, PE, pneumonia, lithiasis, pyelonephritis, viral URI  MDM: 46 year old female presents today for evaluation of generalized bodyaches, chest pain, flank pain ongoing for several days.  She also reports an episode of syncope.  Low risk for DVT per Wells criteria.  Unable to [facility] her out due to initial heart rate of 100 however shared decision making had with patient and patient would Ramos to defer CT scan at this time.  However she does want a CT scan to evaluate for pyelonephritis, nephrolithiasis, or other cause of her flank pain.  Patient's workup does show that patient is influenza A positive.  UA without evidence of UTI.  CBC without leukocytosis or anemia.  CMP with preserved renal function, normal electrolytes.  Troponin negative x 2.  Chest x-ray without evidence of acute cardiopulmonary process.  EKG without acute ischemic changes.  Syncopal episode likely secondary to dehydration.  EKG without concern for arrhythmia.  IV fluids provided in the emergency department.   Lab  Tests: -I ordered, reviewed, and interpreted labs.   The pertinent results include:   Labs Reviewed  RESP PANEL BY RT-PCR (RSV, FLU A&B, COVID)  RVPGX2 - Abnormal; Notable for the following components:      Result Value   Influenza A by PCR POSITIVE (*)    All other components within normal limits  CBC WITH DIFFERENTIAL/PLATELET - Abnormal; Notable for the following components:   RBC 5.41 (*)    MCV 78.4 (*)    MCH 24.8 (*)    RDW 18.2 (*)    All other components within normal limits  COMPREHENSIVE METABOLIC PANEL - Abnormal; Notable for the following components:   CO2 21 (*)    Glucose, Bld 112 (*)    Calcium 8.7 (*)    All other components within normal limits  URINALYSIS, ROUTINE W REFLEX MICROSCOPIC - Abnormal; Notable for the following components:   APPearance CLOUDY (*)    Hgb urine dipstick SMALL (*)    Bacteria, UA RARE (*)    All other components within normal limits  TROPONIN I (HIGH SENSITIVITY)  TROPONIN I (HIGH SENSITIVITY)      EKG  EKG Interpretation  Date/Time:  Sunday August 20 2022 09:58:14 EST Ventricular Rate:  95 PR Interval:  109 QRS Duration: 82 QT Interval:  346 QTC Calculation: 435 R Axis:   71 Text Interpretation: Sinus rhythm Atrial premature complex Short PR interval when compared top rior, similar apperaance. No STEMI Confirmed by Antony Blackbird [phone]) on 08/20/2022 11:18:28 AM         Imaging Studies ordered: I ordered imaging studies including CT renal Inabinet study I independently visualized and interpreted imaging. I agree with the radiologist interpretation   Medicines ordered and prescription drug management: Meds ordered this encounter  Medications   ketorolac (TORADOL) 15 MG/ML injection 15 mg   lactated ringers bolus 1,000 mL   ondansetron (ZOFRAN) injection 4 mg    -I have reviewed the patients home medicines and have made adjustments as needed  Critical interventions IV fluids   Reevaluation: After the  interventions noted above, I reevaluated the patient and found that they have :improved  Co morbidities that complicate the  patient evaluation  Past Medical History:  Diagnosis Date   Anemia    Arthritis    Cervical cancer ([place])    Colitis    Collapsed lung    Crohn's disease ([place])    Edema    Rheumatoid arthritis ([place])       Dispostion: Patient at the end of my shift is awaiting CT renal Taff study.  If this is negative patient is appropriate for discharge.  Prescribed Tamiflu, Zofran ODT.  Plan discussed with patient regarding discharge if CT is negative and she is agreeable.  Ultimate dispo is pending CT. Signed out to [employer].  Final Clinical Impression(s) / ED Diagnoses Final diagnoses:  Influenza A    Rx / DC Orders ED Discharge Orders     None         Evlyn Courier, PA-C 08/20/22 1750    Tegeler, Gwenyth Allegra, MD 08/21/22 [phone]

## 2022-08-20 NOTE — Discharge Instructions (Signed)
Today showed that you are positive for influenza A.  No other concerning cause of your symptoms on your workup today.  We discussed that you are overall low risk for blood clot.  We discussed doing a CT scan however you are in agreement not to have this done given your low risk.  You were concerned regarding the flank pain that you are having.  We did obtain a CT scan of the abdomen which showed no concerning findings  I have given you prescription for Tamiflu, Zofran.  Drink plenty of fluids.  Follow-up with your primary care provider as you need to.  If you do not have 1 you can follow-up with [facility] to establish care.  For any worsening chest pain, shortness of breath please return to the emergency room for evaluation.

## 2023-07-27 ENCOUNTER — Emergency Department (HOSPITAL_COMMUNITY)
Admission: EM | Admit: 2023-07-27 | Discharge: 2023-07-27 | Disposition: A | Discharge status: Home / Self Care | Payer: 59 | Attending: Emergency Medicine | Admitting: Emergency Medicine

## 2023-07-27 ENCOUNTER — Other Ambulatory Visit: Payer: Self-pay

## 2023-07-27 ENCOUNTER — Encounter (HOSPITAL_COMMUNITY): Payer: Self-pay

## 2023-07-27 ENCOUNTER — Emergency Department (HOSPITAL_COMMUNITY): Payer: 59

## 2023-07-27 DIAGNOSIS — R918 Other nonspecific abnormal finding of lung field: Secondary | ICD-10-CM | POA: Diagnosis not present

## 2023-07-27 DIAGNOSIS — R0789 Other chest pain: Secondary | ICD-10-CM | POA: Diagnosis not present

## 2023-07-27 DIAGNOSIS — R079 Chest pain, unspecified: Secondary | ICD-10-CM | POA: Diagnosis not present

## 2023-07-27 DIAGNOSIS — N3000 Acute cystitis without hematuria: Secondary | ICD-10-CM | POA: Insufficient documentation

## 2023-07-27 DIAGNOSIS — R109 Unspecified abdominal pain: Secondary | ICD-10-CM | POA: Diagnosis not present

## 2023-07-27 DIAGNOSIS — R509 Fever, unspecified: Secondary | ICD-10-CM | POA: Diagnosis not present

## 2023-07-27 LAB — BASIC METABOLIC PANEL
Anion gap: 8 (ref 5–15)
BUN: 11 mg/dL (ref 6–20)
CO2: 22 mmol/L (ref 22–32)
Calcium: 9.1 mg/dL (ref 8.9–10.3)
Chloride: 106 mmol/L (ref 98–111)
Creatinine, Ser: 0.75 mg/dL (ref 0.44–1.00)
GFR, Estimated: >60 mL/min (ref >60)
Glucose, Bld: 86 mg/dL (ref 70–99)
Potassium: 4.4 mmol/L (ref 3.5–5.1)
Sodium: 136 mmol/L (ref 135–145)

## 2023-07-27 LAB — URINALYSIS, ROUTINE W REFLEX MICROSCOPIC
Bacteria, UA: NONE SEEN
Bilirubin Urine: NEGATIVE
Glucose, UA: NEGATIVE mg/dL
Hgb urine dipstick: NEGATIVE
Ketones, ur: NEGATIVE mg/dL
Nitrite: NEGATIVE
Protein, ur: NEGATIVE mg/dL
Specific Gravity, Urine: 1.012 (ref 1.005–1.030)
pH: 5 (ref 5.0–8.0)

## 2023-07-27 LAB — CBC
HCT: 45.9 % (ref 36.0–46.0)
Hemoglobin: 14.7 g/dL (ref 12.0–15.0)
MCH: 27.7 pg (ref 26.0–34.0)
MCHC: 32 g/dL (ref 30.0–36.0)
MCV: 86.6 fL (ref 80.0–100.0)
Platelets: 212 10*3/uL (ref 150–400)
RBC: 5.3 MIL/uL — ABNORMAL HIGH (ref 3.87–5.11)
RDW: 16.5 % — ABNORMAL HIGH (ref 11.5–15.5)
WBC: 11 10*3/uL — ABNORMAL HIGH (ref 4.0–10.5)
nRBC: 0 % (ref 0.0–0.2)

## 2023-07-27 LAB — D-DIMER, QUANTITATIVE: D-Dimer, Quant: 0.59 ug{FEU}/mL — ABNORMAL HIGH (ref 0.00–0.50)

## 2023-07-27 LAB — TROPONIN I (HIGH SENSITIVITY)
Troponin I (High Sensitivity): 2 ng/L (ref <18)
Troponin I (High Sensitivity): <2 ng/L (ref <18)

## 2023-07-27 MED ORDER — SODIUM CHLORIDE 0.9 % IV SOLN
2.0000 g | Freq: Once | INTRAVENOUS | Status: AC
  Administered 2023-07-27: 2 g via INTRAVENOUS
  Filled 2023-07-27: qty 20

## 2023-07-27 MED ORDER — IOHEXOL 350 MG/ML SOLN
75.0000 mL | Freq: Once | INTRAVENOUS | Status: AC | PRN
  Administered 2023-07-27: 75 mL via INTRAVENOUS

## 2023-07-27 MED ORDER — LEVOFLOXACIN 500 MG PO TABS: 500.0000 mg | ORAL_TABLET | Freq: Every day | ORAL | 0 refills | Status: DC

## 2023-07-27 MED ORDER — NAPROXEN 375 MG PO TABS: 375.0000 mg | ORAL_TABLET | Freq: Two times a day (BID) | ORAL | 0 refills | Status: DC

## 2023-07-27 MED ORDER — KETOROLAC TROMETHAMINE 30 MG/ML IJ SOLN
30.0000 mg | Freq: Once | INTRAMUSCULAR | Status: AC
  Administered 2023-07-27: 30 mg via INTRAVENOUS
  Filled 2023-07-27: qty 1

## 2023-07-27 NOTE — Discharge Instructions (Addendum)
The test today showed you have a urinary tract infection and possible infection in the lung.  Take the antibiotics until they are complete.  Follow-up with a primary care doctor to be rechecked to make sure symptoms have resolved.

## 2023-07-27 NOTE — ED Provider Notes (Signed)
[place] EMERGENCY DEPARTMENT AT [facility] Provider Note   CSN: [id number] Arrival date & time: 07/27/23  1611     History  Chief Complaint  Patient presents with   Flank Pain    Gloria Ramos is a 47 y.o. female.   Flank Pain     Pt has a sharp pain in her left chest that shoots up into her neck and arm. It increases with lying on the left side.  No cough.  SHe has felt feverish.  Pt also thinks she has a kidney infection.  SHe has been urinating frequently.  Home Medications Prior to Admission medications   Medication Sig Start Date End Date Taking? Authorizing Provider  levofloxacin (LEVAQUIN) 500 MG tablet Take 1 tablet (500 mg total) by mouth daily. 07/27/23  Yes Linwood Dibbles, MD  naproxen (NAPROSYN) 375 MG tablet Take 1 tablet (375 mg total) by mouth 2 (two) times daily. 07/27/23  Yes Linwood Dibbles, MD  cefadroxil (DURICEF) 500 MG capsule Take 1 capsule (500 mg total) by mouth 2 (two) times daily. 05/09/22   Al Decant, PA-C  cephALEXin (KEFLEX) 500 MG capsule Take 1 capsule (500 mg total) by mouth 3 (three) times daily. 05/05/18   Devoria Albe, MD  metroNIDAZOLE (FLAGYL) 500 MG tablet Take 1 tablet (500 mg total) by mouth 2 (two) times daily. 10/21/14   Harle Battiest, NP  Multiple Vitamins-Minerals (MULTIVITAMIN WITH MINERALS) tablet Take 1 tablet by mouth daily.    [provider]  naproxen sodium (ANAPROX) 220 MG tablet Take 440 mg by mouth 2 (two) times daily with a meal.    [provider]  ondansetron (ZOFRAN) 4 MG tablet Take 1 tablet (4 mg total) by mouth every 6 (six) hours. 10/21/14   Harle Battiest, NP  ondansetron (ZOFRAN-ODT) 4 MG disintegrating tablet Take 1 tablet (4 mg total) by mouth every 8 (eight) hours as needed. 08/20/22   Marita Kansas, PA-C  oseltamivir (TAMIFLU) 75 MG capsule Take 1 capsule (75 mg total) by mouth every 12 (twelve) hours. 08/20/22   Marita Kansas, PA-C  phenazopyridine (PYRIDIUM) 200 MG tablet Take 1  tablet (200 mg total) by mouth 3 (three) times daily. 05/09/22   Al Decant, PA-C  traMADol (ULTRAM) 50 MG tablet Take 1 tablet (50 mg total) by mouth every 6 (six) hours as needed. 10/21/14   Harle Battiest, NP      Allergies    Codeine, Food, Hydrocodone, Latex, and Oxycodone    Review of Systems   Review of Systems  Genitourinary:  Positive for flank pain.    Physical Exam Updated Vital Signs BP 110/65   Pulse 65   Temp 98.6 F (37 C) (Oral)   Resp 18   Ht 1.651 m (5\' 5" )   Wt 81.6 kg   LMP 06/22/2023   SpO2 98%   BMI 29.94 kg/m  Physical Exam Vitals and nursing note reviewed.  Constitutional:      General: She is not in acute distress.    Appearance: She is well-developed. She is not diaphoretic.  HENT:     Head: Normocephalic and atraumatic.     Right Ear: External ear normal.     Left Ear: External ear normal.  Eyes:     General: No scleral icterus.       Right eye: No discharge.        Left eye: No discharge.     Conjunctiva/sclera: Conjunctivae normal.  Neck:  Trachea: No tracheal deviation.  Cardiovascular:     Rate and Rhythm: Normal rate and regular rhythm.  Pulmonary:     Effort: Pulmonary effort is normal. No respiratory distress.     Breath sounds: Normal breath sounds. No stridor. No wheezing or rales.  Abdominal:     General: Bowel sounds are normal. There is no distension.     Palpations: Abdomen is soft.     Tenderness: There is no abdominal tenderness. There is no guarding or rebound.  Musculoskeletal:        General: No tenderness or deformity.     Cervical back: Neck supple.  Skin:    General: Skin is warm and dry.     Findings: No rash.  Neurological:     General: No focal deficit present.     Mental Status: She is alert.     Cranial Nerves: No cranial nerve deficit, dysarthria or facial asymmetry.     Sensory: No sensory deficit.     Motor: No abnormal muscle tone or seizure activity.     Coordination: Coordination  normal.  Psychiatric:        Mood and Affect: Mood normal.     ED Results / Procedures / Treatments   Labs (all labs ordered are listed, but only abnormal results are displayed) Labs Reviewed  CBC - Abnormal; Notable for the following components:      Result Value   WBC 11.0 (*)    RBC 5.30 (*)    RDW 16.5 (*)    All other components within normal limits  URINALYSIS, ROUTINE W REFLEX MICROSCOPIC - Abnormal; Notable for the following components:   APPearance HAZY (*)    Leukocytes,Ua SMALL (*)    All other components within normal limits  D-DIMER, QUANTITATIVE - Abnormal; Notable for the following components:   D-Dimer, Quant 0.59 (*)    All other components within normal limits  BASIC METABOLIC PANEL  TROPONIN I (HIGH SENSITIVITY)  TROPONIN I (HIGH SENSITIVITY)    EKG None  Radiology CT Angio Chest PE W and/or Wo Contrast  Result Date: 07/27/2023 CLINICAL DATA:  Pulmonary embolism suspected, low to intermediate probability, positive D-dimer. Left-sided flank pain with burning sensation, increased frequency, and odor. Intermittent fevers. EXAM: CT ANGIOGRAPHY CHEST WITH CONTRAST TECHNIQUE: Multidetector CT imaging of the chest was performed using the standard protocol during bolus administration of intravenous contrast. Multiplanar CT image reconstructions and MIPs were obtained to evaluate the vascular anatomy. RADIATION DOSE REDUCTION: This exam was performed according to the departmental dose-optimization program which includes automated exposure control, adjustment of the mA and/or kV according to patient size and/or use of iterative reconstruction technique. CONTRAST:  75mL OMNIPAQUE IOHEXOL 350 MG/ML SOLN COMPARISON:  None Available. FINDINGS: Cardiovascular: The heart is normal in size and there is a trace pericardial effusion. There is mild atherosclerotic calcification of the aorta without evidence of aneurysm. The pulmonary trunk is normal in caliber. No evidence of  pulmonary embolism is seen. Mediastinum/Nodes: No enlarged mediastinal, hilar, or axillary lymph nodes. Thyroid gland, trachea, and esophagus demonstrate no significant findings. Lungs/Pleura: Bronchial wall thickening is present bilaterally. Mild hazy ground-glass attenuation is noted in the lungs bilaterally. No effusion or pneumothorax. Upper Abdomen: Scattered diverticula are present along the colon without evidence of diverticulitis. No acute abnormality. Musculoskeletal: Degenerative changes are present in the thoracic spine. No acute osseous abnormality is seen. Review of the MIP images confirms the above findings. IMPRESSION: 1. No evidence of pulmonary embolism. 2. Bronchial wall  thickening bilaterally with hazy ground-glass attenuation in the lungs, possible infectious or inflammatory pneumonitis. 3. Aortic atherosclerosis. Electronically Signed   By: Thornell Sartorius M.D.   On: 07/27/2023 21:44   DG Chest 2 View  Result Date: 07/27/2023 CLINICAL DATA:  chest pain EXAM: CHEST - 2 VIEW COMPARISON:  08/20/2022. FINDINGS: The heart size and mediastinal contours are within normal limits. Both lungs are clear. No pneumothorax or pleural effusion. There are thoracic degenerative changes. IMPRESSION: No acute cardiopulmonary disease. Electronically Signed   By: Layla Maw M.D.   On: 07/27/2023 20:01    Procedures Procedures    Medications Ordered in ED Medications  ketorolac (TORADOL) 30 MG/ML injection 30 mg (30 mg Intravenous Given 07/27/23 2016)  cefTRIAXone (ROCEPHIN) 2 g in sodium chloride 0.9 % 100 mL IVPB (0 g Intravenous Stopped 07/27/23 2054)  iohexol (OMNIPAQUE) 350 MG/ML injection 75 mL (75 mLs Intravenous Contrast Given 07/27/23 2126)    ED Course/ Medical Decision Making/ A&P Clinical Course as of 07/27/23 2258  Fri Jul 27, 2023  1936 CBC shows leukocytosis.  Urinalysis does show increased red blood cells and white blood cells.  Metabolic panel normal [JK]  2114 Chest x-ray  without acute findings. [JK]  2116 D-Dimer, Quant(!): 0.59 [JK]  2224 CT angiogram without evidence of PE.  Possible infectious pneumonitis [JK]  2234 Troponins were normal [JK]    Clinical Course User Index [JK] Linwood Dibbles, MD                                 Medical Decision Making Problems Addressed: Acute cystitis without hematuria: acute illness or injury that poses a threat to life or bodily functions Chest pain, unspecified type: acute illness or injury that poses a threat to life or bodily functions  Amount and/or Complexity of Data Reviewed Labs: ordered. Decision-making details documented in ED Course. Radiology: ordered and independent interpretation performed.  Risk Prescription drug management.   Patient presented to ED with complaints of chest pain and urinary tract infection symptoms.  Patient's urinalysis does suggest urinary tract infection.  CBC metabolic panel without acute abnormalities.  Urinalysis is consistent with urinary tract infection but no signs of systemic infection.  Patient's chest pain is atypical for ACS.  She does have chest wall tenderness.  Troponin negative.  Her D-dimer was slightly elevated so CT angiogram was performed.  There is no evidence of PE.  Will discharge home on course of antibiotics to cover pulmonary and urinary tract infection        Final Clinical Impression(s) / ED Diagnoses Final diagnoses:  Chest pain, unspecified type  Acute cystitis without hematuria    Rx / DC Orders ED Discharge Orders          Ordered    levofloxacin (LEVAQUIN) 500 MG tablet  Daily        07/27/23 2254    naproxen (NAPROSYN) 375 MG tablet  2 times daily        07/27/23 2254              Linwood Dibbles, MD 07/27/23 2258

## 2023-07-27 NOTE — ED Triage Notes (Signed)
Patient reports left sided flank pain, burning with urination, increased frequency, and odor x 1 week. Also reports intermittent fevers.

## 2023-07-27 NOTE — ED Notes (Addendum)
Called lab to check on both labs that were sent to lab. Trop and d dimer sent at same time verified by tech  Destineei Loma Sousa

## 2023-07-27 NOTE — ED Notes (Addendum)
Patient c/o severe lower back pain bilateral, severe left side lower abdominal pain, burning on urination with every 5-10 mins she has to pee. At night time when she lays on her right side she has severe left chest pain and  reports she had a collapsed lung over 10 years ago. No chest pain when she is laying down flat right now. No signs of distress noted.

## 2023-07-27 NOTE — ED Notes (Signed)
Lab collected at 2147 as due but no order in due to lab using 2147 order and not putting in new order for baseline trop sent when the blood tube was found in lab to draw. Called lab to put in new order for trop that was due for 2147.

## 2023-07-27 NOTE — ED Notes (Signed)
Nurse to room to obtain labs but patient headed to x-ray at this time.

## 2023-08-03 ENCOUNTER — Emergency Department (HOSPITAL_COMMUNITY): Payer: 59

## 2023-08-03 ENCOUNTER — Emergency Department (HOSPITAL_COMMUNITY)
Admission: EM | Admit: 2023-08-03 | Discharge: 2023-08-03 | Disposition: A | Discharge status: Home / Self Care | Payer: 59 | Attending: Emergency Medicine | Admitting: Emergency Medicine

## 2023-08-03 DIAGNOSIS — D72829 Elevated white blood cell count, unspecified: Secondary | ICD-10-CM | POA: Diagnosis not present

## 2023-08-03 DIAGNOSIS — Z1152 Encounter for screening for COVID-19: Secondary | ICD-10-CM | POA: Diagnosis not present

## 2023-08-03 DIAGNOSIS — R0789 Other chest pain: Secondary | ICD-10-CM | POA: Insufficient documentation

## 2023-08-03 DIAGNOSIS — R079 Chest pain, unspecified: Secondary | ICD-10-CM | POA: Diagnosis not present

## 2023-08-03 DIAGNOSIS — R42 Dizziness and giddiness: Secondary | ICD-10-CM | POA: Diagnosis not present

## 2023-08-03 DIAGNOSIS — Z91040 Latex allergy status: Secondary | ICD-10-CM | POA: Insufficient documentation

## 2023-08-03 DIAGNOSIS — Z8541 Personal history of malignant neoplasm of cervix uteri: Secondary | ICD-10-CM | POA: Diagnosis not present

## 2023-08-03 DIAGNOSIS — R9389 Abnormal findings on diagnostic imaging of other specified body structures: Secondary | ICD-10-CM | POA: Diagnosis not present

## 2023-08-03 DIAGNOSIS — R0602 Shortness of breath: Secondary | ICD-10-CM | POA: Diagnosis not present

## 2023-08-03 LAB — BASIC METABOLIC PANEL
Anion gap: 7 (ref 5–15)
BUN: 16 mg/dL (ref 6–20)
CO2: 21 mmol/L — ABNORMAL LOW (ref 22–32)
Calcium: 9.1 mg/dL (ref 8.9–10.3)
Chloride: 107 mmol/L (ref 98–111)
Creatinine, Ser: 0.82 mg/dL (ref 0.44–1.00)
GFR, Estimated: >60 mL/min (ref >60)
Glucose, Bld: 106 mg/dL — ABNORMAL HIGH (ref 70–99)
Potassium: 3.8 mmol/L (ref 3.5–5.1)
Sodium: 135 mmol/L (ref 135–145)

## 2023-08-03 LAB — TROPONIN I (HIGH SENSITIVITY)
Troponin I (High Sensitivity): 3 ng/L (ref <18)
Troponin I (High Sensitivity): 2 ng/L (ref <18)

## 2023-08-03 LAB — RESP PANEL BY RT-PCR (RSV, FLU A&B, COVID)  RVPGX2
Influenza A by PCR: NEGATIVE
Influenza B by PCR: NEGATIVE
Resp Syncytial Virus by PCR: NEGATIVE
SARS Coronavirus 2 by RT PCR: NEGATIVE

## 2023-08-03 LAB — CBC
HCT: 40.2 % (ref 36.0–46.0)
Hemoglobin: 13.4 g/dL (ref 12.0–15.0)
MCH: 28.4 pg (ref 26.0–34.0)
MCHC: 33.3 g/dL (ref 30.0–36.0)
MCV: 85.2 fL (ref 80.0–100.0)
Platelets: 225 10*3/uL (ref 150–400)
RBC: 4.72 MIL/uL (ref 3.87–5.11)
RDW: 16 % — ABNORMAL HIGH (ref 11.5–15.5)
WBC: 11.8 10*3/uL — ABNORMAL HIGH (ref 4.0–10.5)
nRBC: 0 % (ref 0.0–0.2)

## 2023-08-03 LAB — HCG, QUANTITATIVE, PREGNANCY: hCG, Beta Chain, Quant, S: <1 m[IU]/mL (ref <5)

## 2023-08-03 MED ORDER — IOHEXOL 350 MG/ML SOLN
75.0000 mL | Freq: Once | INTRAVENOUS | Status: AC | PRN
  Administered 2023-08-03: 75 mL via INTRAVENOUS

## 2023-08-03 MED ORDER — SODIUM CHLORIDE (PF) 0.9 % IJ SOLN
INTRAMUSCULAR | Status: AC
  Filled 2023-08-03: qty 50

## 2023-08-03 NOTE — ED Triage Notes (Signed)
Pt arrived reporting dizziness and chest pain since 10 am this morning. States hurts when she takes a deep breath. Denies any other symptoms.

## 2023-08-03 NOTE — Discharge Instructions (Signed)
Your workup today is reassuring.  CT scan did not show any evidence of pneumonia, blood clot or other concerning findings.  You likely have muscle strain.  You can take the anti-inflammatory prescribed the last time.  Return for any concerning symptoms.  I have given you referral to a PCP.  Call to establish care.

## 2023-08-03 NOTE — ED Provider Notes (Signed)
[place] EMERGENCY DEPARTMENT AT [facility] Provider Note   CSN: [id number] Arrival date & time: 08/03/23  1124     History  Chief Complaint  Patient presents with   Chest Pain    Gloria Ramos is a 47 y.o. female.  47 year old female presents today for concern of chest pain.  She states that has worsened since her visit on December 6.  She states she is unable to take a deep breath.  No prior history of DVT or PE.  She did have a PE study which was negative.  She had signs of pneumonitis on the CT scan at that time.  Denies hemoptysis.  No leg swelling.  Has completed her antibiotic course.  The history is provided by the patient. No language interpreter was used.       Home Medications Prior to Admission medications   Medication Sig Start Date End Date Taking? Authorizing Provider  cefadroxil (DURICEF) 500 MG capsule Take 1 capsule (500 mg total) by mouth 2 (two) times daily. 05/09/22   Al Decant, PA-C  cephALEXin (KEFLEX) 500 MG capsule Take 1 capsule (500 mg total) by mouth 3 (three) times daily. 05/05/18   Devoria Albe, MD  levofloxacin (LEVAQUIN) 500 MG tablet Take 1 tablet (500 mg total) by mouth daily. 07/27/23   Linwood Dibbles, MD  metroNIDAZOLE (FLAGYL) 500 MG tablet Take 1 tablet (500 mg total) by mouth 2 (two) times daily. 10/21/14   Harle Battiest, NP  Multiple Vitamins-Minerals (MULTIVITAMIN WITH MINERALS) tablet Take 1 tablet by mouth daily.    [provider]  naproxen (NAPROSYN) 375 MG tablet Take 1 tablet (375 mg total) by mouth 2 (two) times daily. 07/27/23   Linwood Dibbles, MD  naproxen sodium (ANAPROX) 220 MG tablet Take 440 mg by mouth 2 (two) times daily with a meal.    [provider]  ondansetron (ZOFRAN) 4 MG tablet Take 1 tablet (4 mg total) by mouth every 6 (six) hours. 10/21/14   Harle Battiest, NP  ondansetron (ZOFRAN-ODT) 4 MG disintegrating tablet Take 1 tablet (4 mg total) by mouth every 8 (eight) hours as needed.  08/20/22   Marita Kansas, PA-C  oseltamivir (TAMIFLU) 75 MG capsule Take 1 capsule (75 mg total) by mouth every 12 (twelve) hours. 08/20/22   Marita Kansas, PA-C  phenazopyridine (PYRIDIUM) 200 MG tablet Take 1 tablet (200 mg total) by mouth 3 (three) times daily. 05/09/22   Al Decant, PA-C  traMADol (ULTRAM) 50 MG tablet Take 1 tablet (50 mg total) by mouth every 6 (six) hours as needed. 10/21/14   Harle Battiest, NP      Allergies    Codeine, Food, Hydrocodone, Latex, and Oxycodone    Review of Systems   Review of Systems  Constitutional:  Negative for chills and fever.  Respiratory:  Positive for cough and shortness of breath.   Cardiovascular:  Positive for chest pain. Negative for leg swelling.  Gastrointestinal:  Negative for abdominal pain and nausea.  All other systems reviewed and are negative.   Physical Exam Updated Vital Signs BP 124/77   Pulse 80   Temp 98.4 F (36.9 C) (Oral)   Resp 16   Ht 5\' 5"  (1.651 m)   Wt 81.6 kg   LMP 08/02/2023 (Exact Date)   SpO2 95%   BMI 29.95 kg/m  Physical Exam Vitals and nursing note reviewed.  Constitutional:      General: She is not in acute distress.  Appearance: Normal appearance. She is not ill-appearing.  HENT:     Head: Normocephalic and atraumatic.     Nose: Nose normal.  Eyes:     General: No scleral icterus.    Extraocular Movements: Extraocular movements intact.     Conjunctiva/sclera: Conjunctivae normal.  Cardiovascular:     Rate and Rhythm: Normal rate and regular rhythm.     Pulses: Normal pulses.     Heart sounds: Normal heart sounds.  Pulmonary:     Effort: Pulmonary effort is normal. No respiratory distress.     Breath sounds: Normal breath sounds. No wheezing or rales.  Abdominal:     General: There is no distension.     Tenderness: There is no abdominal tenderness.  Musculoskeletal:        General: Normal range of motion.     Cervical back: Normal range of motion.  Skin:    General: Skin  is warm and dry.  Neurological:     General: No focal deficit present.     Mental Status: She is alert. Mental status is at baseline.     ED Results / Procedures / Treatments   Labs (all labs ordered are listed, but only abnormal results are displayed) Labs Reviewed  BASIC METABOLIC PANEL - Abnormal; Notable for the following components:      Result Value   CO2 21 (*)    Glucose, Bld 106 (*)    All other components within normal limits  CBC - Abnormal; Notable for the following components:   WBC 11.8 (*)    RDW 16.0 (*)    All other components within normal limits  RESP PANEL BY RT-PCR (RSV, FLU A&B, COVID)  RVPGX2  HCG, QUANTITATIVE, PREGNANCY  TROPONIN I (HIGH SENSITIVITY)  TROPONIN I (HIGH SENSITIVITY)    EKG None  Radiology DG Chest 2 View Result Date: 08/03/2023 CLINICAL DATA:  Chest pain. EXAM: CHEST - 2 VIEW COMPARISON:  07/27/2023. FINDINGS: Bilateral lung fields are clear. Note is again made of elevated left hemidiaphragm. Bilateral costophrenic angles are clear. Normal cardio-mediastinal silhouette. No acute osseous abnormalities. The soft tissues are within normal limits. IMPRESSION: *No active cardiopulmonary disease. Electronically Signed   By: Jules Schick M.D.   On: 08/03/2023 14:05    Procedures Procedures    Medications Ordered in ED Medications  iohexol (OMNIPAQUE) 350 MG/ML injection 75 mL (75 mLs Intravenous Contrast Given 08/03/23 1633)    ED Course/ Medical Decision Making/ A&P                                 Medical Decision Making Amount and/or Complexity of Data Reviewed Labs: ordered. Radiology: ordered.  Risk Prescription drug management.   Medical Decision Making / ED Course   This patient presents to the ED for concern of chest pain, this involves an extensive number of treatment options, and is a complaint that carries with it a high risk of complications and morbidity.  The differential diagnosis includes muscle strain, PE,  pneumonia, ACS  MDM: 47 year old female presents with persistent chest pain.  Worse than previous visit.  There are some evidence of pneumonitis.  X-ray unremarkable.  Will evaluate with CT PE study.  CBC with mild leukocytosis.  Respiratory panel negative.  BMP without acute concern.  Initial troponin negative.  Pregnancy test negative.  EKG without acute ischemic change.  Second Trope negative.  Low suspicion for ACS.  CT angio PE  study unremarkable.  Likely muscle strain.  Symptomatic management discussed.  Patient voices understanding and is in agreement with plan.   Lab Tests: -I ordered, reviewed, and interpreted labs.   The pertinent results include:   Labs Reviewed  BASIC METABOLIC PANEL - Abnormal; Notable for the following components:      Result Value   CO2 21 (*)    Glucose, Bld 106 (*)    All other components within normal limits  CBC - Abnormal; Notable for the following components:   WBC 11.8 (*)    RDW 16.0 (*)    All other components within normal limits  RESP PANEL BY RT-PCR (RSV, FLU A&B, COVID)  RVPGX2  HCG, QUANTITATIVE, PREGNANCY  TROPONIN I (HIGH SENSITIVITY)  TROPONIN I (HIGH SENSITIVITY)      EKG  EKG Interpretation Date/Time:    Ventricular Rate:    PR Interval:    QRS Duration:    QT Interval:    QTC Calculation:   R Axis:      Text Interpretation:           Imaging Studies ordered: I ordered imaging studies including chest x-ray, CT PE study I independently visualized and interpreted imaging. I agree with the radiologist interpretation   Medicines ordered and prescription drug management: Meds ordered this encounter  Medications   iohexol (OMNIPAQUE) 350 MG/ML injection 75 mL    -I have reviewed the patients home medicines and have made adjustments as needed   Reevaluation: After the interventions noted above, I reevaluated the patient and found that they have :improved  Co morbidities that complicate the patient  evaluation  Past Medical History:  Diagnosis Date   Anemia    Arthritis    Cervical cancer (HCC)    Colitis    Collapsed lung    Crohn's disease (HCC)    Edema    Rheumatoid arthritis (HCC)       Dispostion: Discharged in stable condition.  Return precaution discussed.  Patient voices understanding and is in agreement with plan.  Final Clinical Impression(s) / ED Diagnoses Final diagnoses:  Chest wall pain    Rx / DC Orders ED Discharge Orders     None         Marita Kansas, Cordelia Poche 08/03/23 2003    Lonell Grandchild, MD 08/03/23 2155

## 2023-08-03 NOTE — ED Provider Triage Note (Signed)
Emergency Medicine Provider Triage Evaluation Note  Gloria Ramos , a 47 y.o. female  was evaluated in triage.  Pt complains of chest pain.  Review of Systems  Positive:  Negative:   Physical Exam  BP 113/82 (BP Location: Right Arm)   Pulse 97   Temp 98.5 F (36.9 C) (Oral)   Resp 18   Ht 5\' 5"  (1.651 m)   Wt 81.6 kg   LMP 06/22/2023   SpO2 100%   BMI 29.95 kg/m  Gen:   Awake, no distress   Resp:  Normal effort  MSK:   Moves extremities without difficulty  Other:    Medical Decision Making  Medically screening exam initiated at 12:28 PM.  Appropriate orders placed.  Gloria Ramos was informed that the remainder of the evaluation will be completed by another provider, this initial triage assessment does not replace that evaluation, and the importance of remaining in the ED until their evaluation is complete.  Chest pain and dizziness since 10AM this morning. Chest pain worsens with movement and deep inspirations. Also with dry coughing x2-3 days. Denies sick contacts. Patient currently on medication for UTI and bronchitis - last dose will be today.   Denies fever, dyspnea, nausea, vomiting, diarrhea.    Gloria Ramos, [place] 08/03/23 1231

## 2023-12-20 ENCOUNTER — Emergency Department (HOSPITAL_BASED_OUTPATIENT_CLINIC_OR_DEPARTMENT_OTHER)
Admission: EM | Admit: 2023-12-20 | Discharge: 2023-12-20 | Disposition: A | Discharge status: Home / Self Care | Attending: Emergency Medicine | Admitting: Emergency Medicine

## 2023-12-20 ENCOUNTER — Emergency Department (HOSPITAL_BASED_OUTPATIENT_CLINIC_OR_DEPARTMENT_OTHER)

## 2023-12-20 DIAGNOSIS — W540XXA Bitten by dog, initial encounter: Secondary | ICD-10-CM | POA: Diagnosis not present

## 2023-12-20 DIAGNOSIS — Z79899 Other long term (current) drug therapy: Secondary | ICD-10-CM | POA: Insufficient documentation

## 2023-12-20 DIAGNOSIS — Y93K1 Activity, walking an animal: Secondary | ICD-10-CM | POA: Insufficient documentation

## 2023-12-20 DIAGNOSIS — Z23 Encounter for immunization: Secondary | ICD-10-CM | POA: Diagnosis not present

## 2023-12-20 DIAGNOSIS — Z203 Contact with and (suspected) exposure to rabies: Secondary | ICD-10-CM | POA: Diagnosis not present

## 2023-12-20 DIAGNOSIS — S51812A Laceration without foreign body of left forearm, initial encounter: Secondary | ICD-10-CM | POA: Insufficient documentation

## 2023-12-20 DIAGNOSIS — S51852A Open bite of left forearm, initial encounter: Secondary | ICD-10-CM | POA: Diagnosis not present

## 2023-12-20 DIAGNOSIS — Z2914 Encounter for prophylactic rabies immune globin: Secondary | ICD-10-CM | POA: Diagnosis not present

## 2023-12-20 DIAGNOSIS — Z91040 Latex allergy status: Secondary | ICD-10-CM | POA: Diagnosis not present

## 2023-12-20 DIAGNOSIS — S51802A Unspecified open wound of left forearm, initial encounter: Secondary | ICD-10-CM | POA: Diagnosis not present

## 2023-12-20 MED ORDER — LIDOCAINE-EPINEPHRINE (PF) 2 %-1:200000 IJ SOLN
10.0000 mL | Freq: Once | INTRAMUSCULAR | Status: AC
Start: 2023-12-20 — End: 2023-12-20
  Administered 2023-12-20: 10 mL
  Filled 2023-12-20: qty 20

## 2023-12-20 MED ORDER — ONDANSETRON HCL 4 MG/2ML IJ SOLN: 4.0000 mg | Freq: Once | INTRAMUSCULAR | Status: DC

## 2023-12-20 MED ORDER — HYDROCODONE-ACETAMINOPHEN 5-325 MG PO TABS: 1.0000 | ORAL_TABLET | Freq: Four times a day (QID) | ORAL | 0 refills | Status: DC | PRN

## 2023-12-20 MED ORDER — LORAZEPAM 2 MG/ML IJ SOLN
1.0000 mg | Freq: Once | INTRAMUSCULAR | Status: AC
  Administered 2023-12-20: 1 mg via INTRAVENOUS
  Filled 2023-12-20: qty 1

## 2023-12-20 MED ORDER — FENTANYL CITRATE PF 50 MCG/ML IJ SOSY
50.0000 ug | PREFILLED_SYRINGE | Freq: Once | INTRAMUSCULAR | Status: AC
  Administered 2023-12-20: 50 ug via INTRAMUSCULAR
  Filled 2023-12-20: qty 1

## 2023-12-20 MED ORDER — RABIES IMMUNE GLOBULIN 150 UNIT/ML IM INJ
20.0000 [IU]/kg | INJECTION | Freq: Once | INTRAMUSCULAR | Status: AC
  Administered 2023-12-20: 1950 [IU]
  Filled 2023-12-20: qty 14

## 2023-12-20 MED ORDER — ONDANSETRON HCL 4 MG PO TABS: 4.0000 mg | ORAL_TABLET | Freq: Four times a day (QID) | ORAL | 0 refills | Status: DC

## 2023-12-20 MED ORDER — LORAZEPAM 2 MG/ML IJ SOLN: 0.5000 mg | Freq: Once | INTRAMUSCULAR | Status: DC

## 2023-12-20 MED ORDER — TETANUS-DIPHTH-ACELL PERTUSSIS 5-2.5-18.5 LF-MCG/0.5 IM SUSY
0.5000 mL | PREFILLED_SYRINGE | Freq: Once | INTRAMUSCULAR | Status: AC
  Administered 2023-12-20: 0.5 mL via INTRAMUSCULAR
  Filled 2023-12-20: qty 0.5

## 2023-12-20 MED ORDER — AMOXICILLIN-POT CLAVULANATE 875-125 MG PO TABS
1.0000 | ORAL_TABLET | Freq: Once | ORAL | Status: AC
  Administered 2023-12-20: 1 via ORAL
  Filled 2023-12-20: qty 1

## 2023-12-20 MED ORDER — RABIES VACCINE, PCEC IM SUSR
1.0000 mL | Freq: Once | INTRAMUSCULAR | Status: AC
  Administered 2023-12-20: 1 mL via INTRAMUSCULAR
  Filled 2023-12-20: qty 1

## 2023-12-20 MED ORDER — HYDROMORPHONE HCL 1 MG/ML IJ SOLN
1.0000 mg | Freq: Once | INTRAMUSCULAR | Status: AC
  Administered 2023-12-20: 1 mg via INTRAVENOUS
  Filled 2023-12-20: qty 1

## 2023-12-20 MED ORDER — AMOXICILLIN-POT CLAVULANATE 875-125 MG PO TABS: 1.0000 | ORAL_TABLET | Freq: Two times a day (BID) | ORAL | 0 refills | Status: DC

## 2023-12-20 NOTE — ED Notes (Signed)
 Wound rinsed immediately after incident with tap water and peroxide.

## 2023-12-20 NOTE — ED Provider Notes (Signed)
 [place] EMERGENCY DEPARTMENT AT [facility] Provider Note   CSN: [id number] Arrival date & time: 12/20/23  1904     History  Chief Complaint  Patient presents with   Animal Bite    Gloria Ramos is a 48 y.o. female with medical history of rheumatoid arthritis, edema, Crohn's disease, colitis, arthritis and anemia.  Patient presents to ED for evaluation of dog bite.  States that she was walking her dog when another dog got is not known to her her partner came up and bit the patient in the left forearm.  She is unsure if this dogs vaccination status.  She does not know the dog or the owner.  She is not able to find the dog.  She is unsure of her last tetanus update.  Rates pain 10 out of 10, tearful.   Animal Bite      Home Medications Prior to Admission medications   Medication Sig Start Date End Date Taking? Authorizing Provider  HYDROcodone -acetaminophen  (NORCO/VICODIN) 5-325 MG tablet Take 1 tablet by mouth every 6 (six) hours as needed for severe pain (pain score 7-10). 12/20/23  Yes Adel Aden, PA-C  amoxicillin -clavulanate (AUGMENTIN ) 875-125 MG tablet Take 1 tablet by mouth every 12 (twelve) hours. 12/20/23   Adel Aden, PA-C  cefadroxil  (DURICEF) 500 MG capsule Take 1 capsule (500 mg total) by mouth 2 (two) times daily. 05/09/22   Adel Aden, PA-C  cephALEXin  (KEFLEX ) 500 MG capsule Take 1 capsule (500 mg total) by mouth 3 (three) times daily. 05/05/18   Knapp, Iva, MD  levofloxacin  (LEVAQUIN ) 500 MG tablet Take 1 tablet (500 mg total) by mouth daily. 07/27/23   Trish Furl, MD  metroNIDAZOLE  (FLAGYL ) 500 MG tablet Take 1 tablet (500 mg total) by mouth 2 (two) times daily. 10/21/14   Gayl Katos, NP  Multiple Vitamins-Minerals (MULTIVITAMIN WITH MINERALS) tablet Take 1 tablet by mouth daily.    [provider]  naproxen  (NAPROSYN ) 375 MG tablet Take 1 tablet (375 mg total) by mouth 2 (two) times daily. 07/27/23   Trish Furl, MD   naproxen  sodium (ANAPROX ) 220 MG tablet Take 440 mg by mouth 2 (two) times daily with a meal.    [provider]  ondansetron  (ZOFRAN ) 4 MG tablet Take 1 tablet (4 mg total) by mouth every 6 (six) hours. 10/21/14   Gayl Katos, NP  ondansetron  (ZOFRAN -ODT) 4 MG disintegrating tablet Take 1 tablet (4 mg total) by mouth every 8 (eight) hours as needed. 08/20/22   Ceclia Cohens, Amjad, PA-C  oseltamivir  (TAMIFLU ) 75 MG capsule Take 1 capsule (75 mg total) by mouth every 12 (twelve) hours. 08/20/22   Lucina Sabal, PA-C  phenazopyridine  (PYRIDIUM ) 200 MG tablet Take 1 tablet (200 mg total) by mouth 3 (three) times daily. 05/09/22   Adel Aden, PA-C  traMADol  (ULTRAM ) 50 MG tablet Take 1 tablet (50 mg total) by mouth every 6 (six) hours as needed. 10/21/14   Gayl Katos, NP      Allergies    Codeine, Food, Hydrocodone , Latex, and Oxycodone    Review of Systems   Review of Systems  Skin:  Positive for wound.  All other systems reviewed and are negative.   Physical Exam Updated Vital Signs BP 105/78   Pulse 71   Temp 98 F (36.7 C)   Resp 18   Ht 5\' 5"  (1.651 m)   Wt 99 kg   SpO2 94%   BMI 36.32 kg/m  Physical Exam Vitals  and nursing note reviewed.  Constitutional:      General: She is not in acute distress.    Appearance: She is well-developed.  HENT:     Head: Normocephalic and atraumatic.  Eyes:     Conjunctiva/sclera: Conjunctivae normal.  Cardiovascular:     Rate and Rhythm: Normal rate and regular rhythm.     Heart sounds: No murmur heard. Pulmonary:     Effort: Pulmonary effort is normal. No respiratory distress.     Breath sounds: Normal breath sounds.  Abdominal:     Palpations: Abdomen is soft.     Tenderness: There is no abdominal tenderness.  Musculoskeletal:        General: Signs of injury present. No swelling.     Cervical back: Neck supple.  Skin:    General: Skin is warm and dry.     Capillary Refill: Capillary refill takes less than 2  seconds.     Comments: 10 cm laceration volar surface of left forearm.  3 puncture wounds inferiorly to main injury.  Patient has full flexion and extension of left wrist.  Neurovascularly intact.  2+ radial pulse in the left hand.  Neurological:     Mental Status: She is alert.  Psychiatric:        Mood and Affect: Mood normal.       ED Results / Procedures / Treatments   Labs (all labs ordered are listed, but only abnormal results are displayed) Labs Reviewed - No data to display  EKG None  Radiology DG Forearm Left Result Date: 12/20/2023 CLINICAL DATA:  Dog bite to the left forearm with open wound. EXAM: LEFT FOREARM - 2 VIEW COMPARISON:  None Available. FINDINGS: Left radius and ulna appear intact. No evidence of acute fracture or dislocation. Overlying bandage material obscures detail but no radiopaque soft tissue foreign bodies are demonstrated. There is evidence of subcutaneous soft tissue gas along the anterior forearm consistent with history of penetrating injury. No focal bone lesion or bone destruction. IMPRESSION: 1. No acute bony abnormalities. 2. Soft tissue gas demonstrated consistent with penetrating injury. No radiopaque soft tissue foreign bodies. Electronically Signed   By: Boyce Byes M.D.   On: 12/20/2023 19:45    Procedures .Laceration Repair  Date/Time: 12/20/2023 9:24 PM  Performed by: Adel Aden, PA-C Authorized by: Adel Aden, PA-C   Consent:    Consent obtained:  Verbal   Risks discussed:  Infection, need for additional repair, nerve damage, poor cosmetic result, pain and poor wound healing   Alternatives discussed:  No treatment Universal protocol:    Patient identity confirmed:  Verbally with patient Anesthesia:    Anesthesia method:  Local infiltration   Local anesthetic:  Lidocaine  2% WITH epi Laceration details:    Location:  Shoulder/arm   Shoulder/arm location:  L lower arm   Length (cm):  10 Exploration:    Limited  defect created (wound extended): no     Imaging obtained: x-ray     Imaging outcome: foreign body not noted     Wound exploration: wound explored through full range of motion   Treatment:    Area cleansed with:  Saline   Amount of cleaning:  Standard   Irrigation solution:  Sterile water   Irrigation method:  Pressure wash   Visualized foreign bodies/material removed: no     Debridement:  None Skin repair:    Repair method:  Sutures   Suture size:  3-0   Suture material:  Prolene  Suture technique:  Simple interrupted   Number of sutures:  3 Approximation:    Approximation:  Loose Repair type:    Repair type:  Simple Post-procedure details:    Dressing:  Bulky dressing   Procedure completion:  Tolerated well, no immediate complications .Laceration Repair  Date/Time: 12/20/2023 9:26 PM  Performed by: Adel Aden, PA-C Authorized by: Adel Aden, PA-C   Consent:    Consent obtained:  Verbal   Risks discussed:  Infection, need for additional repair, nerve damage, pain, poor cosmetic result and poor wound healing Universal protocol:    Patient identity confirmed:  Arm band Anesthesia:    Anesthesia method:  Local infiltration   Local anesthetic:  Lidocaine  2% WITH epi Laceration details:    Location:  Shoulder/arm   Shoulder/arm location:  L lower arm   Length (cm):  1 Exploration:    Hemostasis achieved with:  Direct pressure   Imaging obtained: x-ray     Imaging outcome: foreign body not noted   Treatment:    Area cleansed with:  Saline   Amount of cleaning:  Standard   Irrigation solution:  Sterile saline   Irrigation method:  Pressure wash Skin repair:    Repair method:  Sutures   Suture size:  5-0   Suture material:  Prolene   Suture technique:  Simple interrupted   Number of sutures:  1 Approximation:    Approximation:  Loose Repair type:    Repair type:  Simple Post-procedure details:    Dressing:  Bulky dressing   Procedure  completion:  Tolerated well, no immediate complications    Medications Ordered in ED Medications  fentaNYL  (SUBLIMAZE ) injection 50 mcg (50 mcg Intramuscular Given 12/20/23 2002)  Tdap (BOOSTRIX ) injection 0.5 mL (0.5 mLs Intramuscular Given 12/20/23 2017)  HYDROmorphone  (DILAUDID ) injection 1 mg (1 mg Intravenous Given 12/20/23 2011)  LORazepam  (ATIVAN ) injection 1 mg (1 mg Intravenous Given 12/20/23 2011)  rabies vaccine  (RABAVERT ) injection 1 mL (1 mL Intramuscular Given 12/20/23 2038)  rabies immune globulin  (HYPERRAB/KEDRAB ) injection 1,950 Units (1,950 Units Infiltration Given 12/20/23 2038)  amoxicillin -clavulanate (AUGMENTIN ) 875-125 MG per tablet 1 tablet (1 tablet Oral Given 12/20/23 2018)  lidocaine -EPINEPHrine (XYLOCAINE W/EPI) 2 %-1:200000 (PF) injection 10 mL (10 mLs Other Given 12/20/23 2037)    ED Course/ Medical Decision Making/ A&P    Medical Decision Making Amount and/or Complexity of Data Reviewed Radiology: ordered.  Risk Prescription drug management.   48 year old female presents for evaluation.  Please see HPI for further details.  On examination patient does have a 10 cm laceration to her volar surface of left forearm.  Also has 4 puncture wounds inferior to initial injury.  X-ray imaging collected.  No foreign bodies.  There is soft tissue gas consistent with penetrating injury.  Patient tetanus updated.  Patient received initial dose of Augmentin  here in the department.  She is unsure of the dog that bit her's vaccination status so she received RabAvert , first series of immunoglobulin.  Patient had wounds repaired per procedure note.  She did require 1 mg Dilaudid , 50 mcg fentanyl , 1 mg of Ativan  so that the patient was able to tolerate procedure.  Her wounds were loosely closed.  Puncture wounds left open.  At this time the patient had her arm wrapped.  Will discharge her home.  Will provide her with follow-up information for hand surgery if wound is not healing  appropriately.  Advised her that she will need to return to the ED on day  3, 7 and 14 for follow-up vaccination series.  Patient received return precautions and she voiced understanding.  She had all her questions answered her satisfaction.  Stable to discharge home.  Addendum: Patient requesting pain medication to go home with.  Has allergies to hydrocodone  and oxycodone in chart.  Reports that she has taken these medications without issue.  She is requesting these.  Sent in to her pharmacy.  Final Clinical Impression(s) / ED Diagnoses Final diagnoses:  Dog bite of left forearm, initial encounter    Rx / DC Orders ED Discharge Orders          Ordered    amoxicillin -clavulanate (AUGMENTIN ) 875-125 MG tablet  Every 12 hours,   Status:  Discontinued        12/20/23 2123    amoxicillin -clavulanate (AUGMENTIN ) 875-125 MG tablet  Every 12 hours        12/20/23 2143    HYDROcodone -acetaminophen  (NORCO/VICODIN) 5-325 MG tablet  Every 6 hours PRN        12/20/23 2144              Adel Aden, PA-C 12/20/23 2145    Scarlette Currier, MD 12/22/23 1241

## 2023-12-20 NOTE — Discharge Instructions (Addendum)
 It was a pleasure taking part in your care.  As we discussed, please continue to monitor your wound at home.  Please keep it wrapped with gauze.  Please do not submerge your left arm into baths or bodies of water.  You may take a shower and allow the water to run over the wound.  Please begin taking Augmentin  twice a day for the next 7 days.  Please take pain medication every 6 hours as needed for pain.  Do not drive or operate machinery while taking pain medication.  Please read the attached guide concerning animal bites.  You will need to follow-up on day 3, 7 and 14.  Day 3 will be to 5/4.  Please follow-up at urgent care location and bring form provided in this packet or return to this ED.  If your wound is not healing appropriately, you may follow-up with Dr. Huntley Mai who is a Hydrographic surveyor.  Please follow-up with your PCP as well to ensure that your wound is healing appropriately.  Return to the ED with any new or worsening symptoms.

## 2023-12-20 NOTE — ED Triage Notes (Signed)
 Pt POV after breaking up fight between her dog and another unknown dog. Open wound noted to L forearm, rewrapped in triage, bleeding controlled at this time.

## 2023-12-20 NOTE — ED Notes (Signed)
 Wound care performed. Xeroform, gauze, gauze roll, and coban applied.

## 2023-12-29 ENCOUNTER — Other Ambulatory Visit: Payer: Self-pay

## 2023-12-29 ENCOUNTER — Emergency Department (HOSPITAL_BASED_OUTPATIENT_CLINIC_OR_DEPARTMENT_OTHER)
Admission: EM | Admit: 2023-12-29 | Discharge: 2023-12-29 | Disposition: A | Discharge status: Home / Self Care | Attending: Emergency Medicine | Admitting: Emergency Medicine

## 2023-12-29 ENCOUNTER — Encounter (HOSPITAL_BASED_OUTPATIENT_CLINIC_OR_DEPARTMENT_OTHER): Payer: Self-pay | Admitting: *Deleted

## 2023-12-29 DIAGNOSIS — Z5189 Encounter for other specified aftercare: Secondary | ICD-10-CM

## 2023-12-29 DIAGNOSIS — Z79899 Other long term (current) drug therapy: Secondary | ICD-10-CM | POA: Insufficient documentation

## 2023-12-29 DIAGNOSIS — Z91040 Latex allergy status: Secondary | ICD-10-CM | POA: Insufficient documentation

## 2023-12-29 DIAGNOSIS — Z4800 Encounter for change or removal of nonsurgical wound dressing: Secondary | ICD-10-CM | POA: Diagnosis not present

## 2023-12-29 DIAGNOSIS — M7918 Myalgia, other site: Secondary | ICD-10-CM

## 2023-12-29 DIAGNOSIS — G8918 Other acute postprocedural pain: Secondary | ICD-10-CM | POA: Diagnosis not present

## 2023-12-29 DIAGNOSIS — M79601 Pain in right arm: Secondary | ICD-10-CM | POA: Diagnosis present

## 2023-12-29 DIAGNOSIS — M25511 Pain in right shoulder: Secondary | ICD-10-CM | POA: Diagnosis not present

## 2023-12-29 DIAGNOSIS — M79602 Pain in left arm: Secondary | ICD-10-CM | POA: Diagnosis not present

## 2023-12-29 DIAGNOSIS — Z4801 Encounter for change or removal of surgical wound dressing: Secondary | ICD-10-CM | POA: Diagnosis not present

## 2023-12-29 MED ORDER — OXYCODONE-ACETAMINOPHEN 5-325 MG PO TABS: 1.0000 | ORAL_TABLET | Freq: Four times a day (QID) | ORAL | 0 refills | Status: DC | PRN

## 2023-12-29 MED ORDER — SULFAMETHOXAZOLE-TRIMETHOPRIM 800-160 MG PO TABS
1.0000 | ORAL_TABLET | Freq: Once | ORAL | Status: AC
  Administered 2023-12-29: 1 via ORAL
  Filled 2023-12-29: qty 1

## 2023-12-29 MED ORDER — SULFAMETHOXAZOLE-TRIMETHOPRIM 800-160 MG PO TABS: 1.0000 | ORAL_TABLET | Freq: Two times a day (BID) | ORAL | 0 refills | Status: AC

## 2023-12-29 MED ORDER — ONDANSETRON HCL 4 MG PO TABS: 4.0000 mg | ORAL_TABLET | Freq: Four times a day (QID) | ORAL | 0 refills | Status: DC

## 2023-12-29 MED ORDER — OXYCODONE-ACETAMINOPHEN 5-325 MG PO TABS
2.0000 | ORAL_TABLET | Freq: Once | ORAL | Status: AC
  Administered 2023-12-29: 2 via ORAL
  Filled 2023-12-29: qty 2

## 2023-12-29 MED ORDER — SULFAMETHOXAZOLE-TRIMETHOPRIM 800-160 MG PO TABS: 1.0000 | ORAL_TABLET | Freq: Two times a day (BID) | ORAL | 0 refills | Status: DC

## 2023-12-29 MED ORDER — ONDANSETRON 4 MG PO TBDP
4.0000 mg | ORAL_TABLET | Freq: Once | ORAL | Status: AC
  Administered 2023-12-29: 4 mg via ORAL
  Filled 2023-12-29: qty 1

## 2023-12-29 NOTE — ED Notes (Signed)
 Pt CA&O to baseline. D/C instructions discussed with patient and all questions answered. All belongings with patient. Pt ambulatory on discharge.

## 2023-12-29 NOTE — Discharge Instructions (Addendum)
 As we discussed, your wound is in the process of healing and I removed the sutures.  You may apply Neosporin to the wound but please keep it open.  You need to finish your antibiotics as prescribed and I have added Bactrim twice daily for a week  As we discussed, the right deltoid likely has a small hematoma from the injection.  You may ice it to keep the swelling down  I have prescribed some Percocet for pain and you may take Zofran  with it if you are nauseated  You can call Dr. Glenora Laos from hand surgery for follow-up  Return to ER if you have severe pain or purulent discharge or fever

## 2023-12-29 NOTE — ED Provider Notes (Signed)
 [place] [facility]GENCY DEPARTMENT AT [facility] Provider Note   CSN: [id number] Arrival date & time: 12/29/23  1754     History  Chief Complaint  Patient presents with   Arm Pain    Gloria Ramos is a 48 y.o. female here presenting with left arm pain and pain with injection.  Patient had dog bite to the left forearm on May 1 and came to the ER and had sutures placed.  Patient was also put on Augmentin .  Since the dog had unknown immunization status patient received tetanus and rabies vaccine  and immunoglobulin.  Per the chart, patient had immunoglobulin in the right deltoid.  Patient states that she has been keeping her arm covered this whole time.  When they change the dressing yesterday they noticed more drainage from the wound.  Patient is still taking her Augmentin  has 5 tablets left.  Patient also noticed hematoma of the right deltoid and wants to get it checked out.  Denies any fevers.  Patient also ran out of her pain medicine.  Finally, patient does not want to finish her rabies series.  The history is provided by the patient.       Home Medications Prior to Admission medications   Medication Sig Start Date End Date Taking? Authorizing Provider  amoxicillin -clavulanate (AUGMENTIN ) 875-125 MG tablet Take 1 tablet by mouth every 12 (twelve) hours. 12/20/23   Adel Aden, PA-C  cefadroxil  (DURICEF) 500 MG capsule Take 1 capsule (500 mg total) by mouth 2 (two) times daily. 05/09/22   Adel Aden, PA-C  cephALEXin  (KEFLEX ) 500 MG capsule Take 1 capsule (500 mg total) by mouth 3 (three) times daily. 05/05/18   Knapp, Iva, MD  HYDROcodone -acetaminophen  (NORCO/VICODIN) 5-325 MG tablet Take 1 tablet by mouth every 6 (six) hours as needed for severe pain (pain score 7-10). 12/20/23   Adel Aden, PA-C  levofloxacin  (LEVAQUIN ) 500 MG tablet Take 1 tablet (500 mg total) by mouth daily. 07/27/23   Trish Furl, MD  metroNIDAZOLE  (FLAGYL ) 500 MG tablet Take 1 tablet  (500 mg total) by mouth 2 (two) times daily. 10/21/14   Gayl Katos, NP  Multiple Vitamins-Minerals (MULTIVITAMIN WITH MINERALS) tablet Take 1 tablet by mouth daily.    [provider]  naproxen  (NAPROSYN ) 375 MG tablet Take 1 tablet (375 mg total) by mouth 2 (two) times daily. 07/27/23   Trish Furl, MD  naproxen  sodium (ANAPROX ) 220 MG tablet Take 440 mg by mouth 2 (two) times daily with a meal.    [provider]  ondansetron  (ZOFRAN ) 4 MG tablet Take 1 tablet (4 mg total) by mouth every 6 (six) hours. 12/20/23   Adel Aden, PA-C  ondansetron  (ZOFRAN -ODT) 4 MG disintegrating tablet Take 1 tablet (4 mg total) by mouth every 8 (eight) hours as needed. 08/20/22   Ceclia Cohens, Amjad, PA-C  oseltamivir  (TAMIFLU ) 75 MG capsule Take 1 capsule (75 mg total) by mouth every 12 (twelve) hours. 08/20/22   Ceclia Cohens, Amjad, PA-C  phenazopyridine  (PYRIDIUM ) 200 MG tablet Take 1 tablet (200 mg total) by mouth 3 (three) times daily. 05/09/22   Adel Aden, PA-C  traMADol  (ULTRAM ) 50 MG tablet Take 1 tablet (50 mg total) by mouth every 6 (six) hours as needed. 10/21/14   Gayl Katos, NP      Allergies    Codeine, Food, Hydrocodone , Latex, and Oxycodone    Review of Systems   Review of Systems  Skin:  Positive for wound.  All other systems  reviewed and are negative.   Physical Exam Updated Vital Signs BP (!) 108/59 (BP Location: Left Arm)   Temp 97.9 F (36.6 C) (Oral)   Resp 16   SpO2 98%  Physical Exam Vitals and nursing note reviewed.  HENT:     Head: Normocephalic.     Nose: Nose normal.     Mouth/Throat:     Mouth: Mucous membranes are moist.  Eyes:     Extraocular Movements: Extraocular movements intact.     Pupils: Pupils are equal, round, and reactive to light.  Cardiovascular:     Rate and Rhythm: Normal rate.     Pulses: Normal pulses.     Heart sounds: Normal heart sounds.  Pulmonary:     Effort: Pulmonary effort is normal.     Breath sounds:  Normal breath sounds.  Abdominal:     General: Abdomen is flat.     Palpations: Abdomen is soft.  Musculoskeletal:     Cervical back: Normal range of motion.     Comments: Right deltoid with small hematoma.  No obvious cellulitis.  Left forearm with sutures in place and there is mild erythema and discharge around the wound.  There is no obvious abscess formation.  Patient is able to flex and extend the fingers.  Neurological:     General: No focal deficit present.     Mental Status: She is alert and oriented to person, place, and time.  Psychiatric:        Mood and Affect: Mood normal.        Behavior: Behavior normal.     ED Results / Procedures / Treatments   Labs (all labs ordered are listed, but only abnormal results are displayed) Labs Reviewed - No data to display  EKG None  Radiology No results found.  Procedures Procedures    SUTURE REMOVAL Performed by: Florette Hurry  Consent: Verbal consent obtained. Patient identity confirmed: provided demographic data Time out: Immediately prior to procedure a "time out" was called to verify the correct patient, procedure, equipment, support staff and site/side marked as required.  Location details: L forearm   Wound Appearance: clean  Sutures/Staples Removed: 5  Facility: sutures placed in this facility Patient tolerance: Patient tolerated the procedure well with no immediate complications.    Medications Ordered in ED Medications  oxyCODONE-acetaminophen  (PERCOCET/ROXICET) 5-325 MG per tablet 2 tablet (2 tablets Oral Given 12/29/23 1827)  ondansetron  (ZOFRAN -ODT) disintegrating tablet 4 mg (4 mg Oral Given 12/29/23 1828)  sulfamethoxazole-trimethoprim (BACTRIM DS) 800-160 MG per tablet 1 tablet (1 tablet Oral Given 12/29/23 1828)    ED Course/ Medical Decision Making/ A&P                                 Medical Decision Making Gloria Ramos is a 48 y.o. female here presenting with wound check.  Overall, the wound  appears to be healing and I was able to remove the sutures.  Patient still has 5 tablets of Augmentin  left.  I will give a course of Bactrim in addition to that.  Patient also has hematoma to the right deltoid likely from the rabies immunoglobulin shot.  Patient requests more pain medicine to go home with.  I also will have her follow-up with hand surgery for wound check.  Finally we discussed about finishing the rabies vaccine  course and patient states that she does not want to do the additional shots.  Problems Addressed: Pain of right deltoid: acute illness or injury Visit for wound check: acute illness or injury  Risk Prescription drug management.   Final Clinical Impression(s) / ED Diagnoses Final diagnoses:  None    Rx / DC Orders ED Discharge Orders     None         Dalene Duck, MD 12/29/23 [phone]

## 2023-12-29 NOTE — ED Notes (Signed)
 Pt dog bit wound to L forearm has mild swelling with slight redness around wounds. Delana Favors MD removed sutures. Pt has good distal PMS to LUE.

## 2023-12-29 NOTE — ED Triage Notes (Signed)
 Pt to ED POV reporting painful lump in right deltoid from recent injection on Thursday. Patient was bitten by a dog and received tetanus and Rabies injections but is unsure which shot was given in the right arm. Bruising and swelling noted to the arm.   Patient also requesting to have dog bite looked at as the pain has worsened and there are "black spots" in the bite.

## 2024-01-17 ENCOUNTER — Ambulatory Visit (INDEPENDENT_AMBULATORY_CARE_PROVIDER_SITE_OTHER): Admitting: Family

## 2024-01-17 ENCOUNTER — Encounter: Payer: Self-pay | Admitting: Family

## 2024-01-17 VITALS — BP 105/72 | HR 65 | Ht 65.0 in | Wt 219.0 lb

## 2024-01-17 DIAGNOSIS — G35 Multiple sclerosis: Secondary | ICD-10-CM | POA: Diagnosis not present

## 2024-01-17 DIAGNOSIS — F32A Depression, unspecified: Secondary | ICD-10-CM

## 2024-01-17 DIAGNOSIS — Z1211 Encounter for screening for malignant neoplasm of colon: Secondary | ICD-10-CM | POA: Diagnosis not present

## 2024-01-17 DIAGNOSIS — F419 Anxiety disorder, unspecified: Secondary | ICD-10-CM

## 2024-01-17 DIAGNOSIS — N926 Irregular menstruation, unspecified: Secondary | ICD-10-CM

## 2024-01-17 DIAGNOSIS — Z1231 Encounter for screening mammogram for malignant neoplasm of breast: Secondary | ICD-10-CM | POA: Diagnosis not present

## 2024-01-17 DIAGNOSIS — S41102A Unspecified open wound of left upper arm, initial encounter: Secondary | ICD-10-CM | POA: Diagnosis not present

## 2024-01-17 DIAGNOSIS — K5090 Crohn's disease, unspecified, without complications: Secondary | ICD-10-CM | POA: Diagnosis not present

## 2024-01-17 MED ORDER — NAPROXEN 500 MG PO TABS: 500.0000 mg | ORAL_TABLET | Freq: Three times a day (TID) | ORAL | 0 refills | Status: DC

## 2024-01-17 NOTE — Patient Instructions (Addendum)
 Welcome to [facility], It was a pleasure meeting you today!    Monitor your arm wound and call back if it is not healing over the next 2 weeks. Clean with soap and water every day, ok to apply vaseline for moisture, cover with a non-stick gauze and secure with the kerlix (wrap gauze). Keep wound covered to avoid infection as long as it is still draining.  As discussed, I have sent a referral to our neurology and gastroenterology offices.  I have sent a referral to our therapy team for counseling. But you have to call their office to schedule your first appointment. Call [facility] at [phone].   Please schedule 1 month follow up visit today if there are issues we did not get to discuss today.    PLEASE NOTE: If you had any LAB tests please let us  know if you have not heard back within a few days. You may see your results on MyChart before we have a chance to review them but we will give you a call once they are reviewed by us . If we ordered any REFERRALS today, please let us  know if you have not heard from their office within the next week.  Let us  know through MyChart if you are needing REFILLS, or have your pharmacy send us  the request. You can also use MyChart to communicate with me or any office staff.  Please try these tips to maintain a healthy lifestyle: It is important that you exercise regularly at least 30 minutes 5 times a week. Think about what you will eat, plan ahead. Choose whole foods, & think  "clean, green, fresh or frozen" over canned, processed or packaged foods which are more sugary, salty, and fatty. 70 to 75% of food eaten should be fresh vegetables and protein. 2-3  meals daily with healthy snacks between meals, but must be whole fruit, protein or vegetables. Aim to eat over a 10 hour period when you are active, for example, 7am to 5pm, and then STOP after your last meal of the day, drinking only water.  Shorter eating  windows, 6-8 hours, are showing benefits in heart disease and blood sugar regulation. Drink water every day! Shoot for 64 ounces daily = 8 cups, no other drink is as healthy! Fruit juice is best enjoyed in a healthy way, by EATING the fruit.

## 2024-01-17 NOTE — Progress Notes (Signed)
 New Patient Office Visit  Subjective:  Patient ID: Gloria Ramos, female    DOB: 1976-07-30  Age: 48 y.o. MRN: [id number]  CC:  Chief Complaint  Patient presents with   New Patient (Initial Visit)   Menopause    Pt c/o hot flashes, mood swings,heavy cycles, and hot flashes. Present for 7 years. Pt states Gloria Ramos has 2-3 cycles a month.    Leg Pain    Pt c/o Pins and needles sensation along with numbness in bilateral legs, present for a few months.    HPI Gloria Ramos presents for establishing care today.  Discussed the use of AI scribe software for clinical note transcription with the patient, who gave verbal consent to proceed.  History of Present Illness Gloria Ramos is a 48 year old female with multiple sclerosis who presents with multiple chronic issues including a recent arm injury, leg pain, tingling, and numbness. Gloria Ramos is accompanied by her daughter.  Gloria Ramos experiences leg pain, tingling, and numbness in both feet, described as extreme tingling with episodes of numbness. Similar symptoms affect her hands, leading to difficulty grasping objects. Gloria Ramos has multiple sclerosis, diagnosed in 2012, and is not on medication due to concerns about side effects. Previous imaging studies have not identified a clear cause for her symptoms.  Gloria Ramos has vision problems, with extremely blurry vision at night, and is unable to see the TV. 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  Gloria Ramos experiences significant anxiety, frequent crying, and panic attacks, which have been worsening. Sleep is disrupted, with excessive sleep at night and difficulty staying awake during the day. Gloria Ramos has taken Xanax for anxiety in the past as well as other antidepressants.   Gloria Ramos reports having three periods a month for the past seven years, with heavy and frequent cycles. Depo shots did not resolve the issue. Recently, Gloria Ramos missed periods in January and February, but March  brought heavy bleeding again.  Gloria Ramos has a history of carpal tunnel syndrome with severe wrist symptoms and cysts on her ovaries causing significant left-sided pain every few months. Gloria Ramos sustained a dog bite on her arm, which required stitches and 2 rounds of antibiotics. The stitches were removed and her wound is open and is significantly sensitive and painful.  Assessment & Plan Multiple Sclerosis DX in 2012 by Neuro in [place], records requested. New leg tingling and numbness, left ear hearing loss, right ear tinnitus, and blurry vision, possibly related to MS. No current medication due to potential side effects. - Refer to neurology for evaluation and management.  Crohn's Disease Diagnosed in 2008-2009. Previous colonoscopy and endoscopy performed. - Refer for colonoscopy to evaluate current status.  Anxiety & deoression Experiencing anxiety and panic attacks, possibly exacerbated by perimenopausal changes.Experiencing symptoms of depression, possibly exacerbated by perimenopausal changes.  No current medication or therapy, hx of taking many different medications. - Refer to psychiatry for evaluation and management.  Irregular menses- Reports heavy menses and irregular. History of cervical dysplasia with colposcopy in 2012. No recent Pap smear. Has appt with daughters GYN. - Follow up with gynecology for irregular menses & Pap smear on June 30th.  Arm wound - Dog bite on left anterior forearm. Open wound approx 6cm in length, 3.5cm in width, 0.3cm in depth, no s/s infection, sanguinous drg. - Wound cleaned with betadine, covered with non-stick dressing and secured with kerlix, coban strips. - Advised to clean wound with saline or soap  and water daily, cover with gauze. - Advised on s/s of infection and when to call office.     Outpatient Medications Prior to Visit  Medication Sig Dispense Refill   Multiple Vitamins-Minerals (MULTIVITAMIN WITH MINERALS) tablet Take 1 tablet by mouth daily.      amoxicillin -clavulanate (AUGMENTIN ) 875-125 MG tablet Take 1 tablet by mouth every 12 (twelve) hours. (Patient not taking: Reported on 01/17/2024) 14 tablet 0   cefadroxil  (DURICEF) 500 MG capsule Take 1 capsule (500 mg total) by mouth 2 (two) times daily. (Patient not taking: Reported on 01/17/2024) 14 capsule 0   cephALEXin  (KEFLEX ) 500 MG capsule Take 1 capsule (500 mg total) by mouth 3 (three) times daily. (Patient not taking: Reported on 01/17/2024) 30 capsule 0   HYDROcodone -acetaminophen  (NORCO/VICODIN) 5-325 MG tablet Take 1 tablet by mouth every 6 (six) hours as needed for severe pain (pain score 7-10). (Patient not taking: Reported on 01/17/2024) 10 tablet 0   levofloxacin  (LEVAQUIN ) 500 MG tablet Take 1 tablet (500 mg total) by mouth daily. (Patient not taking: Reported on 01/17/2024) 7 tablet 0   metroNIDAZOLE  (FLAGYL ) 500 MG tablet Take 1 tablet (500 mg total) by mouth 2 (two) times daily. (Patient not taking: Reported on 01/17/2024) 14 tablet 0   naproxen  (NAPROSYN ) 375 MG tablet Take 1 tablet (375 mg total) by mouth 2 (two) times daily. (Patient not taking: Reported on 01/17/2024) 20 tablet 0   naproxen  sodium (ANAPROX ) 220 MG tablet Take 440 mg by mouth 2 (two) times daily with a meal. (Patient not taking: Reported on 01/17/2024)     ondansetron  (ZOFRAN ) 4 MG tablet Take 1 tablet (4 mg total) by mouth every 6 (six) hours. (Patient not taking: Reported on 01/17/2024) 12 tablet 0   ondansetron  (ZOFRAN -ODT) 4 MG disintegrating tablet Take 1 tablet (4 mg total) by mouth every 8 (eight) hours as needed. (Patient not taking: Reported on 01/17/2024) 20 tablet 0   oseltamivir  (TAMIFLU ) 75 MG capsule Take 1 capsule (75 mg total) by mouth every 12 (twelve) hours. (Patient not taking: Reported on 01/17/2024) 10 capsule 0   oxyCODONE -acetaminophen  (PERCOCET) 5-325 MG tablet Take 1 tablet by mouth every 6 (six) hours as needed. (Patient not taking: Reported on 01/17/2024) 10 tablet 0   phenazopyridine   (PYRIDIUM ) 200 MG tablet Take 1 tablet (200 mg total) by mouth 3 (three) times daily. (Patient not taking: Reported on 01/17/2024) 6 tablet 0   traMADol  (ULTRAM ) 50 MG tablet Take 1 tablet (50 mg total) by mouth every 6 (six) hours as needed. (Patient not taking: Reported on 01/17/2024) 15 tablet 0   No facility-administered medications prior to visit.   Past Medical History:  Diagnosis Date   Anemia    Arthritis    Cervical cancer (HCC)    Colitis    Collapsed lung    Crohn's disease (HCC)    Edema    Rheumatoid arthritis (HCC)    Past Surgical History:  Procedure Laterality Date   ABDOMINAL SURGERY     CERVICAL CONE BIOPSY  2015   TUBAL LIGATION      Objective:   Today's Vitals: BP 105/72 (BP Location: Left Arm, Patient Position: Sitting, Cuff Size: Large)   Pulse 65   Ht 5\' 5"  (1.651 m)   Wt 219 lb (99.3 kg)   LMP 12/28/2023 (Exact Date)   SpO2 96%   BMI 36.44 kg/m   Physical Exam Vitals and nursing note reviewed.  Constitutional:      Appearance: Normal appearance. Gloria Ramos  is obese.  Cardiovascular:     Rate and Rhythm: Normal rate and regular rhythm.  Pulmonary:     Effort: Pulmonary effort is normal.     Breath sounds: Normal breath sounds.  Musculoskeletal:        General: Normal range of motion.  Skin:    General: Skin is warm and dry.     Findings: Wound (left anterior arm, 6cm in length, 3.5cm in width, 0.3cm in depth, no s/s infection, sanguinous drg.) present.       Neurological:     Mental Status: Gloria Ramos is alert.  Psychiatric:        Mood and Affect: Mood normal.        Behavior: Behavior normal.     Meds ordered this encounter  Medications   naproxen  (NAPROSYN ) 500 MG tablet    Sig: Take 1 tablet (500 mg total) by mouth 3 (three) times daily with meals.    Dispense:  30 tablet    Refill:  0    Supervising Provider:   ANDY, CAMILLE L [[id number]]   *Without further workup and treatment planned, risk to pt's morbidity is high with possible prolonged  functional impairment and poor health outcomes.   *Extra time ( ) spent with patient today which consisted of chart review, discussing diagnoses, work up, treatment, answering questions, and documentation.  Versa Gore, NP

## 2024-01-29 ENCOUNTER — Encounter: Payer: Self-pay | Admitting: Family

## 2024-01-29 DIAGNOSIS — N926 Irregular menstruation, unspecified: Secondary | ICD-10-CM | POA: Insufficient documentation

## 2024-01-29 DIAGNOSIS — K5090 Crohn's disease, unspecified, without complications: Secondary | ICD-10-CM | POA: Insufficient documentation

## 2024-01-29 NOTE — Assessment & Plan Note (Signed)
 Reports heavy menses and irregular. History of cervical dysplasia with colposcopy in 2012. No recent Pap smear. Has appt with daughters GYN. - Follow up with gynecology for irregular menses & Pap smear on June 30th.

## 2024-01-29 NOTE — Assessment & Plan Note (Signed)
 DX in 2012 by Neuro in [place], records requested. New leg tingling and numbness possibly related to MS. No current medication due to potential side effects. - Refer to neurology for evaluation and management.

## 2024-01-29 NOTE — Assessment & Plan Note (Signed)
 Experiencing anxiety and panic attacks, possibly exacerbated by perimenopausal changes.Experiencing symptoms of depression, possibly exacerbated by perimenopausal changes.  No current medication or therapy, hx of taking many different medications. - Refer to psychiatry for evaluation and management.

## 2024-01-29 NOTE — Assessment & Plan Note (Addendum)
 Diagnosed in 2008-2009. Previous colonoscopy and endoscopy performed. Advised to complete Medical record release forms. - Refer for colonoscopy to evaluate current status.

## 2024-02-07 ENCOUNTER — Encounter: Payer: Self-pay | Admitting: Radiology

## 2024-02-07 ENCOUNTER — Ambulatory Visit
Admission: RE | Admit: 2024-02-07 | Discharge: 2024-02-07 | Disposition: A | Discharge status: Home / Self Care | Source: Ambulatory Visit | Attending: Family | Admitting: Family

## 2024-02-07 DIAGNOSIS — Z1231 Encounter for screening mammogram for malignant neoplasm of breast: Secondary | ICD-10-CM

## 2024-02-11 ENCOUNTER — Encounter: Payer: Self-pay | Admitting: Neurology
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
## 2024-02-13 ENCOUNTER — Ambulatory Visit (HOSPITAL_COMMUNITY): Admitting: Psychiatry

## 2024-02-13 ENCOUNTER — Encounter (HOSPITAL_COMMUNITY): Payer: Self-pay | Admitting: Psychiatry

## 2024-02-13 ENCOUNTER — Telehealth: Payer: Self-pay | Admitting: *Deleted

## 2024-02-13 ENCOUNTER — Encounter (HOSPITAL_COMMUNITY): Payer: Self-pay

## 2024-02-13 ENCOUNTER — Other Ambulatory Visit: Payer: Self-pay | Admitting: Family

## 2024-02-13 DIAGNOSIS — N951 Menopausal and female climacteric states: Secondary | ICD-10-CM

## 2024-02-13 DIAGNOSIS — F4310 Post-traumatic stress disorder, unspecified: Secondary | ICD-10-CM

## 2024-02-13 DIAGNOSIS — F410 Panic disorder [episodic paroxysmal anxiety] without agoraphobia: Secondary | ICD-10-CM | POA: Diagnosis not present

## 2024-02-13 DIAGNOSIS — G629 Polyneuropathy, unspecified: Secondary | ICD-10-CM | POA: Diagnosis not present

## 2024-02-13 DIAGNOSIS — R928 Other abnormal and inconclusive findings on diagnostic imaging of breast: Secondary | ICD-10-CM

## 2024-02-13 DIAGNOSIS — F1290 Cannabis use, unspecified, uncomplicated: Secondary | ICD-10-CM

## 2024-02-13 MED ORDER — DULOXETINE HCL 30 MG PO CPEP: 30.0000 mg | ORAL_CAPSULE | Freq: Every day | ORAL | 1 refills | Status: DC

## 2024-02-13 MED ORDER — HYDROXYZINE HCL 25 MG PO TABS: 12.5000 mg | ORAL_TABLET | Freq: Two times a day (BID) | ORAL | 1 refills | Status: DC | PRN

## 2024-02-13 NOTE — Patient Instructions (Addendum)
 Thank you for attending your appointment today.  -- START Cymbalta 30 mg daily -- START Atarax 12.5-25 mg twice daily as needed for acute anxiety or sleep -- Continue other medications as prescribed. -- Please reach out to [facility] for trauma focused therapy to see if they take your insurance. Another great resource is [email or web address] where you can search for therapists by location, insurance, and specialty.   Please do not make any changes to medications without first discussing with your provider. If you are experiencing a psychiatric emergency, please call 911 or present to your nearest emergency department. Additional crisis, medication management, and therapy resources are included below.  [facility]  [address], [place], [place] [address] [phone] WALK-IN URGENT CARE 24/7 FOR ANYONE [address], [place], [place]  [phone] Fax: [phone] [email or web address] *Interpreters available *Accepts all insurance and uninsured for Urgent Care needs *Accepts Medicaid and uninsured for outpatient treatment (below)      ONLY FOR [facility]  Below:    Outpatient New Patient Assessment/Therapy Walk-ins:        Monday, Wednesday, and Thursday 8am until slots are full (first come, first served)                   New Patient Psychiatry/Medication Management        Monday-Friday 8am-11am (first come, first served)               For all walk-ins we ask that you arrive by 7:15am, because patients will be seen in the order of arrival.

## 2024-02-13 NOTE — Telephone Encounter (Signed)
 Left patient a message to call the office prior to appointment to receive and give office information.

## 2024-02-18 ENCOUNTER — Encounter: Payer: Self-pay | Admitting: Obstetrics & Gynecology

## 2024-02-18 ENCOUNTER — Other Ambulatory Visit (HOSPITAL_COMMUNITY)
Admission: RE | Admit: 2024-02-18 | Discharge: 2024-02-18 | Disposition: A | Discharge status: Home / Self Care | Source: Ambulatory Visit | Attending: Obstetrics & Gynecology | Admitting: Obstetrics & Gynecology

## 2024-02-18 ENCOUNTER — Ambulatory Visit: Admitting: Obstetrics & Gynecology

## 2024-02-18 VITALS — BP 144/83 | HR 76 | Ht 65.0 in | Wt 216.0 lb

## 2024-02-18 DIAGNOSIS — N926 Irregular menstruation, unspecified: Secondary | ICD-10-CM

## 2024-02-18 DIAGNOSIS — R102 Pelvic and perineal pain: Secondary | ICD-10-CM | POA: Insufficient documentation

## 2024-02-18 DIAGNOSIS — I10 Essential (primary) hypertension: Secondary | ICD-10-CM | POA: Diagnosis not present

## 2024-02-18 NOTE — Progress Notes (Signed)
 Subjective:     Gloria Ramos is a 48 y.o. female here for a routine exam.  Current complaints: wants pap smear.     Gynecologic History Patient's last menstrual period was 11/28/2023 (exact date). Contraception: none Last Pap: 2016. Results were: normal Last mammogram: ordered today Colonoscpy:  2016--  Obstetric History OB History  Gravida Para Term Preterm AB Living  5 3 3  2 3   SAB IAB Ectopic Multiple Live Births  2        # Outcome Date GA Lbr Len/2nd Weight Sex Type Anes PTL Lv  5 SAB           4 SAB           3 Term      Vag-Spont     2 Term      Vag-Spont     1 Term      Vag-Spont        The following portions of the patient's history were reviewed and updated as appropriate: allergies, current medications, past family history, past medical history, past social history, past surgical history, and problem list.  Review of Systems Pertinent items noted in HPI and remainder of comprehensive ROS otherwise negative.    Objective:     Vitals:   02/18/24 1427  BP: (!) 144/83  Pulse: 76  Weight: 216 lb (98 kg)  Height: 5' 5 (1.651 m)   Vitals:  WNL General appearance: alert, cooperative and no distress  HEENT: Normocephalic, without obvious abnormality, atraumatic Eyes: negative Throat: lips, mucosa, and tongue normal; teeth and gums normal  Respiratory: Clear to auscultation bilaterally  CV: Regular rate and rhythm  Breasts:  Normal appearance, no masses or tenderness, no nipple retraction or dimpling  GI: Soft, non-tender; bowel sounds normal; no masses,  no organomegaly  GU: External Genitalia:  Tanner V, no lesion Urethra:  No prolapse   Vagina: Pink, normal rugae, no blood or discharge  Cervix: No CMT, no lesion  Uterus:  Normal size and contour, non tender  Adnexa: Normal, no masses, non tender  Musculoskeletal: No edema, redness or tenderness in the calves or thighs  Skin: No lesions or rash  Lymphatic: Axillary adenopathy: none     Psychiatric:  Normal mood and behavior        Assessment:    Healthy female exam.    Plan:    1.  Pap with co testing 2.  Yearly mammograms 3.  Colon cancer screening:   4.  Looking to get disability--had lapse in care due to insurance.   5.  HTN--f/u with PCP 6. Pt has had bilateral pelvic pain and history of ovarian cysts  She feels like it has returned.  Pelvic US  complete with TVUS  L>R. 7.  Irregular menses--has 2-3 periods a month for now skipping menses:  Pt to keep detailed menstrual diary and will discuss at next appt.

## 2024-02-18 NOTE — Progress Notes (Signed)
 Pain in right lower pelvic, comes and goes. Just wants pap smear today. Declines STI testing. GAD 7  (10) PHQ (9)

## 2024-02-21 ENCOUNTER — Ambulatory Visit (INDEPENDENT_AMBULATORY_CARE_PROVIDER_SITE_OTHER)

## 2024-02-21 DIAGNOSIS — R102 Pelvic and perineal pain: Secondary | ICD-10-CM

## 2024-02-21 DIAGNOSIS — N83202 Unspecified ovarian cyst, left side: Secondary | ICD-10-CM | POA: Diagnosis not present

## 2024-02-21 LAB — CYTOLOGY - PAP
Comment: NEGATIVE
Diagnosis: NEGATIVE
High risk HPV: NEGATIVE

## 2024-03-03 ENCOUNTER — Other Ambulatory Visit (HOSPITAL_COMMUNITY)

## 2024-03-04 ENCOUNTER — Ambulatory Visit

## 2024-03-04 ENCOUNTER — Ambulatory Visit
Admission: RE | Admit: 2024-03-04 | Discharge: 2024-03-04 | Disposition: A | Discharge status: Home / Self Care | Source: Ambulatory Visit | Attending: Family | Admitting: Family

## 2024-03-04 DIAGNOSIS — R922 Inconclusive mammogram: Secondary | ICD-10-CM | POA: Diagnosis not present

## 2024-03-04 DIAGNOSIS — R928 Other abnormal and inconclusive findings on diagnostic imaging of breast: Secondary | ICD-10-CM

## 2024-03-04 DIAGNOSIS — N6489 Other specified disorders of breast: Secondary | ICD-10-CM | POA: Diagnosis not present
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
## 2024-03-19 ENCOUNTER — Encounter (HOSPITAL_COMMUNITY): Payer: Self-pay | Admitting: Psychiatry

## 2024-03-19 ENCOUNTER — Other Ambulatory Visit: Payer: Self-pay

## 2024-03-19 ENCOUNTER — Encounter (HOSPITAL_COMMUNITY): Payer: Self-pay

## 2024-03-19 ENCOUNTER — Telehealth (HOSPITAL_COMMUNITY): Admitting: Psychiatry

## 2024-03-19 DIAGNOSIS — F1290 Cannabis use, unspecified, uncomplicated: Secondary | ICD-10-CM

## 2024-03-19 DIAGNOSIS — F4310 Post-traumatic stress disorder, unspecified: Secondary | ICD-10-CM | POA: Diagnosis not present

## 2024-03-19 DIAGNOSIS — F410 Panic disorder [episodic paroxysmal anxiety] without agoraphobia: Secondary | ICD-10-CM | POA: Diagnosis not present

## 2024-03-19 MED ORDER — DULOXETINE HCL 20 MG PO CPEP
20.0000 mg | ORAL_CAPSULE | Freq: Every day | ORAL | 2 refills | Status: DC
  Filled 2024-03-19: qty 30, 30d supply, fill #0

## 2024-03-19 MED ORDER — HYDROXYZINE HCL 25 MG PO TABS
12.5000 mg | ORAL_TABLET | Freq: Two times a day (BID) | ORAL | 2 refills | Status: DC | PRN
  Filled 2024-03-19: qty 60, 30d supply, fill #0

## 2024-03-19 NOTE — Patient Instructions (Addendum)
 Thank you for attending your appointment today.  -- START Cymbalta  20 mg daily -- START hydroxyzine  12.5-25 mg twice daily as needed for panic attacks -- Continue other medications as prescribed. -- Please get your labs performed at [facility] at your earliest convenience; I have sent these order forms to you in a Mychart message  Please do not make any changes to medications without first discussing with your provider. If you are experiencing a psychiatric emergency, please call 911 or present to your nearest emergency department. Additional crisis, medication management, and therapy resources are included below.  [facility]  [address] [place], [place] [address] [phone] WALK-IN URGENT CARE 24/7 FOR ANYONE [address], [place], [place]  [phone] Fax: [phone] [email or web address] *Interpreters available *Accepts all insurance and uninsured for Urgent Care needs *Accepts Medicaid and uninsured for outpatient treatment (below)      ONLY FOR [facility]  Below:    Outpatient New Patient Assessment/Therapy Walk-ins:        Monday, Wednesday, and Thursday 8am until slots are full (first come, first served)                   New Patient Psychiatry/Medication Management        Monday-Friday 8am-11am (first come, first served)               For all walk-ins we ask that you arrive by 7:15am, because patients will be seen in the order of arrival.

## 2024-03-19 NOTE — Addendum Note (Signed)
 Addended by: MERCY DOMINO A on: 03/19/2024 05:00 PM   Modules accepted: Orders

## 2024-03-26 NOTE — Progress Notes (Deleted)
 NEUROLOGY CONSULTATION NOTE  Gloria Ramos MRN: [id number] DOB: 1976/07/06  Referring provider: Corean Comment, NP Primary care provider: Corean Comment, NP  Reason for consult:  multiple sclerosis  Assessment/Plan:   Multiple sclerosis  Will first check MRI of brain/cervical/thoracic spine with and without contrast.  Further recommendations pending results. Check labs:  CBC wit diff, CMP, vit D, JC Virus antibody and index   Subjective:  Gloria Ramos is a 48 year old ***-handed female with Crohn's disease, rheumatoid arthritis, PTSD and history of cervical cancer who presents to establish care for multiple sclerosis.  History supplemented by *** and referring provider's note.  She was diagnosed with multiple sclerosis in 2012 in [place] .  ***.  She never started on DMT due to concerns about side effects.    Since then, she continues to experience multiple symptoms: Vision:  blurred vision at night, difficult seeing the TV. 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  Experiences high-pitched ringing in the right ear.  *** Motor:  difficulty grasping objects.  Has history of carpal tunnel syndrome.   Sensory:  numbness and tingling in both feet and hands Pain:  leg pain Gait:  *** Bowel/Bladder:  *** Fatigue:  *** Cognition:  *** Mood:  Significant anxiety, panic attacks, frequent crying.  Poor sleep.  ***      PAST MEDICAL HISTORY: Past Medical History:  Diagnosis Date   Anemia    Arthritis    Cervical cancer (HCC)    Colitis    Collapsed lung    Crohn's disease (HCC)    Edema    Multiple sclerosis (HCC)    Pelvic pain in female 09/30/2014   PTSD (post-traumatic stress disorder)    Rheumatoid arthritis (HCC)     PAST SURGICAL HISTORY: Past Surgical History:  Procedure Laterality Date   ABDOMINAL SURGERY     CERVICAL CONE BIOPSY  2015   TUBAL LIGATION      MEDICATIONS: Current Outpatient Medications on File Prior to Visit  Medication Sig  Dispense Refill   DULoxetine  (CYMBALTA ) 20 MG capsule Take 1 capsule (20 mg total) by mouth daily. 30 capsule 2   hydrOXYzine  (ATARAX ) 25 MG tablet Take 0.5-1 tablets (12.5-25 mg total) by mouth 2 (two) times daily as needed for anxiety (or sleep). 60 tablet 2   Multiple Vitamins-Minerals (MULTIVITAMIN WITH MINERALS) tablet Take 1 tablet by mouth daily.     naproxen  (NAPROSYN ) 500 MG tablet Take 1 tablet (500 mg total) by mouth 3 (three) times daily with meals. (Patient not taking: Reported on 02/18/2024) 30 tablet 0   No current facility-administered medications on file prior to visit.    ALLERGIES: Allergies  Allergen Reactions   Codeine Hives and Nausea And Vomiting   Food     Lobster-swelling like a blowfish   Hydrocodone  Hives   Latex     Instant rash, itching and bladder infection   Oxycodone  Hives and Nausea And Vomiting    FAMILY HISTORY: Family History  Problem Relation Age of Onset   Breast cancer Maternal Aunt    Cervical cancer Maternal Aunt    Breast cancer Maternal Aunt    BRCA 1/2 Neg Hx     Objective:  *** General: No acute distress.  Patient appears well-groomed.   Head:  Normocephalic/atraumatic Eyes:  fundi examined but not visualized Neck: supple, no paraspinal tenderness, full range of motion Heart: regular rate and rhythm Neurological Exam: Mental status: alert and oriented to person, place, and time, speech  fluent and not dysarthric, language intact. Cranial nerves: CN I: not tested CN II: pupils equal, round and reactive to light, visual fields intact CN III, IV, VI:  full range of motion, no nystagmus, no ptosis CN V: facial sensation intact. CN VII: upper and lower face symmetric CN VIII: hearing intact CN IX, X: gag intact, uvula midline CN XI: sternocleidomastoid and trapezius muscles intact CN XII: tongue midline Bulk & Tone: normal, no fasciculations. Motor:  muscle strength 5/5 throughout Sensation:  Pinprick, temperature and  vibratory sensation intact. Deep Tendon Reflexes:  2+ throughout,  toes downgoing.   Finger to nose testing:  Without dysmetria.   Heel to shin:  Without dysmetria.   Gait:  Normal station and stride.  Romberg negative.    Thank you for allowing me to take part in the care of this patient.  Juliene Dunnings, DO  CC: ***

## 2024-03-27 ENCOUNTER — Encounter: Payer: Self-pay | Admitting: Neurology

## 2024-03-27 ENCOUNTER — Other Ambulatory Visit: Payer: Self-pay

## 2024-03-27 ENCOUNTER — Ambulatory Visit: Admitting: Neurology

## 2024-03-27 ENCOUNTER — Ambulatory Visit: Payer: Self-pay | Admitting: Obstetrics & Gynecology

## 2024-03-28 ENCOUNTER — Other Ambulatory Visit: Payer: Self-pay

## 2024-04-15 ENCOUNTER — Other Ambulatory Visit: Payer: Self-pay | Admitting: Family

## 2024-04-15 DIAGNOSIS — S41102A Unspecified open wound of left upper arm, initial encounter: Secondary | ICD-10-CM

## 2024-04-25 ENCOUNTER — Other Ambulatory Visit: Payer: Self-pay

## 2024-05-13 ENCOUNTER — Other Ambulatory Visit (HOSPITAL_COMMUNITY): Payer: Self-pay | Admitting: Psychiatry

## 2024-05-13 DIAGNOSIS — F4310 Post-traumatic stress disorder, unspecified: Secondary | ICD-10-CM | POA: Diagnosis not present

## 2024-05-16 ENCOUNTER — Ambulatory Visit (HOSPITAL_COMMUNITY): Admitting: Clinical

## 2024-05-16 ENCOUNTER — Ambulatory Visit (HOSPITAL_BASED_OUTPATIENT_CLINIC_OR_DEPARTMENT_OTHER)

## 2024-05-16 ENCOUNTER — Encounter (HOSPITAL_COMMUNITY): Payer: Self-pay

## 2024-05-16 ENCOUNTER — Ambulatory Visit (INDEPENDENT_AMBULATORY_CARE_PROVIDER_SITE_OTHER): Admitting: Orthopaedic Surgery

## 2024-05-16 ENCOUNTER — Encounter: Payer: Self-pay | Admitting: Family

## 2024-05-16 ENCOUNTER — Ambulatory Visit (INDEPENDENT_AMBULATORY_CARE_PROVIDER_SITE_OTHER): Admitting: Family

## 2024-05-16 VITALS — BP 118/80 | HR 70 | Temp 97.0°F | Ht 65.0 in | Wt 219.2 lb

## 2024-05-16 DIAGNOSIS — Q159 Congenital malformation of eye, unspecified: Secondary | ICD-10-CM

## 2024-05-16 DIAGNOSIS — M25561 Pain in right knee: Secondary | ICD-10-CM | POA: Diagnosis not present

## 2024-05-16 DIAGNOSIS — Z122 Encounter for screening for malignant neoplasm of respiratory organs: Secondary | ICD-10-CM | POA: Diagnosis not present

## 2024-05-16 DIAGNOSIS — K50919 Crohn's disease, unspecified, with unspecified complications: Secondary | ICD-10-CM

## 2024-05-16 DIAGNOSIS — K625 Hemorrhage of anus and rectum: Secondary | ICD-10-CM

## 2024-05-16 DIAGNOSIS — N951 Menopausal and female climacteric states: Secondary | ICD-10-CM

## 2024-05-16 DIAGNOSIS — F4310 Post-traumatic stress disorder, unspecified: Secondary | ICD-10-CM

## 2024-05-16 DIAGNOSIS — M25569 Pain in unspecified knee: Secondary | ICD-10-CM | POA: Diagnosis not present

## 2024-05-16 DIAGNOSIS — K0889 Other specified disorders of teeth and supporting structures: Secondary | ICD-10-CM | POA: Diagnosis not present

## 2024-05-16 LAB — CBC WITH DIFFERENTIAL/PLATELET
Basophils Absolute: 0.1 x10E3/uL (ref 0.0–0.2)
Basos: 1 %
EOS (ABSOLUTE): 1 x10E3/uL — ABNORMAL HIGH (ref 0.0–0.4)
Eos: 9 %
Hematocrit: 47.6 % — ABNORMAL HIGH (ref 34.0–46.6)
Hemoglobin: 15.2 g/dL (ref 11.1–15.9)
Immature Grans (Abs): 0.1 x10E3/uL (ref 0.0–0.1)
Immature Granulocytes: 1 %
Lymphocytes Absolute: 3 x10E3/uL (ref 0.7–3.1)
Lymphs: 27 %
MCH: 28.4 pg (ref 26.6–33.0)
MCHC: 31.9 g/dL (ref 31.5–35.7)
MCV: 89 fL (ref 79–97)
Monocytes Absolute: 0.7 x10E3/uL (ref 0.1–0.9)
Monocytes: 7 %
Neutrophils Absolute: 6 x10E3/uL (ref 1.4–7.0)
Neutrophils: 55 %
Platelets: 228 x10E3/uL (ref 150–450)
RBC: 5.36 x10E6/uL — ABNORMAL HIGH (ref 3.77–5.28)
RDW: 14.2 % (ref 11.7–15.4)
WBC: 10.8 x10E3/uL (ref 3.4–10.8)

## 2024-05-16 LAB — URINE DRUG PANEL 7
Amphetamines, Urine: NEGATIVE ng/mL
Barbiturate Quant, Ur: NEGATIVE ng/mL
Benzodiazepine Quant, Ur: NEGATIVE ng/mL
Cocaine (Metab.): NEGATIVE ng/mL
Creatinine, Urine: 118.8 mg/dL (ref 20.0–300.0)
Nitrite Urine, Quantitative: NEGATIVE ug/mL
OPIATE SCREEN URINE: NEGATIVE ng/mL
PCP Quant, Ur: NEGATIVE ng/mL
pH, Urine: 5.4 (ref 4.5–8.9)

## 2024-05-16 LAB — LIPID PANEL
Chol/HDL Ratio: 6.4 ratio — ABNORMAL HIGH (ref 0.0–4.4)
Cholesterol, Total: 262 mg/dL — ABNORMAL HIGH (ref 100–199)
HDL: 41 mg/dL (ref >39)
LDL Chol Calc (NIH): 177 mg/dL — ABNORMAL HIGH (ref 0–99)
Triglycerides: 233 mg/dL — ABNORMAL HIGH (ref 0–149)
VLDL Cholesterol Cal: 44 mg/dL — ABNORMAL HIGH (ref 5–40)

## 2024-05-16 LAB — CANNABINOID CONFIRMATION, UR
CANNABINOIDS: POSITIVE — AB
Carboxy THC GC/MS Conf: >750 ng/mL

## 2024-05-16 LAB — THYROID PANEL WITH TSH
Free Thyroxine Index: 2.5 (ref 1.2–4.9)
T3 Uptake Ratio: 27 % (ref 24–39)
T4, Total: 9.1 ug/dL (ref 4.5–12.0)
TSH: 1.94 u[IU]/mL (ref 0.450–4.500)

## 2024-05-16 LAB — HEMOGLOBIN A1C
Est. average glucose Bld gHb Est-mCnc: 108 mg/dL
Hgb A1c MFr Bld: 5.4 % (ref 4.8–5.6)

## 2024-05-16 LAB — VITAMIN B12: Vitamin B-12: 320 pg/mL (ref 232–1245)

## 2024-05-16 LAB — VITAMIN D 25 HYDROXY (VIT D DEFICIENCY, FRACTURES): Vit D, 25-Hydroxy: 33.7 ng/mL (ref 30.0–100.0)

## 2024-05-16 NOTE — Patient Instructions (Addendum)
 It was very nice to see you today!   Stop taking the Cymbalta  since it is not helping you and it is causing nausea and you feel like it is worsening your anxiety. The hydroxyzine  also is not helping with your sleep, sounds like it takes too long to kick in and then when it does you sleep for too long.  You should stop this medication also. You could try using a pill splitter to cut the hydroxyzine  in half and take a half twice during the day to help with anxiety. Call Dr. Wanita office and let her know about the above concerns with the medication.  You also need to contact them through MyChart or call the office on Monday about your missed therapy session.   I am sending a new referral to [facility] gastroenterology to see if you can get in faster regarding your rectal bleeding and history of Crohn's disease.  See below for the phone number and if you do not hear from them by the end of next week you can call them directly.  You can also contact our office if you are unable to reach them.  You need to contact your gynecologist again regarding your perimenopausal symptoms and discuss other options besides hysterectomy.  I have sent referrals for a dentist and for ophthalmology.  Please contact our office if you have not heard anything from these offices by the end of next week.    PLEASE NOTE:  If you had any lab tests please let us  know if you have not heard back within a few days. You may see your results on MyChart before we have a chance to review them but we will give you a call once they are reviewed by us . If we ordered any referrals today, please let us  know if you have not heard from their office within the next week.

## 2024-05-16 NOTE — Progress Notes (Signed)
 Chief Complaint: Right knee instability     History of Present Illness:    Gloria Ramos is a 48 y.o. female dents today with ongoing right knee patellar instability.  She most recently experiences dislocation of the patella 2 weeks prior where the kneecap came out and she had to manually reduce this.  She does have a history of this occurring earlier this year as well which was her first initial dislocation event.  This time she is very trephinated has had experiences with this on the contralateral side as well.    PMH/PSH/Family History/Social History/Meds/Allergies:    Past Medical History:  Diagnosis Date   Anemia    Arthritis    Cervical cancer (HCC)    Colitis    Collapsed lung    Crohn's disease (HCC)    Edema    Multiple sclerosis    Pelvic pain in female 09/30/2014   PTSD (post-traumatic stress disorder)    Rheumatoid arthritis (HCC)    Past Surgical History:  Procedure Laterality Date   ABDOMINAL SURGERY     CERVICAL CONE BIOPSY  2015   TUBAL LIGATION     Social History   Socioeconomic History   Marital status: Significant Other    Spouse name: Not on file   Number of children: Not on file   Years of education: Not on file   Highest education level: Not on file  Occupational History   Not on file  Tobacco Use   Smoking status: Some Days    Current packs/day: 0.25    Types: Cigarettes   Smokeless tobacco: Never  Vaping Use   Vaping status: Some Days   Substances: Nicotine  Substance and Sexual Activity   Alcohol use: Never   Drug use: Yes    Types: Marijuana    Comment: daily   Sexual activity: Yes    Partners: Male    Birth control/protection: Surgical  Other Topics Concern   Not on file  Social History Narrative   Not on file   Social Drivers of Health   Financial Resource Strain: High Risk (05/13/2024)   Overall Financial Resource Strain (CARDIA)    Difficulty of Paying Living Expenses: Very hard  Food Insecurity: Food Insecurity  Present (05/13/2024)   Hunger Vital Sign    Worried About Running Out of Food in the Last Year: Often true    Ran Out of Food in the Last Year: Often true  Transportation Needs: Unmet Transportation Needs (05/13/2024)   PRAPARE - Transportation    Lack of Transportation (Medical): Yes    Lack of Transportation (Non-Medical): Yes  Physical Activity: Unknown (05/13/2024)   Exercise Vital Sign    Days of Exercise per Week: Patient declined    Minutes of Exercise per Session: Not on file  Stress: Patient Declined (05/13/2024)   [employer] of Occupational Health - Occupational Stress Questionnaire    Feeling of Stress: Patient declined  Social Connections: Unknown (05/13/2024)   Social Connection and Isolation Panel    Frequency of Communication with Friends and Family: Patient declined    Frequency of Social Gatherings with Friends and Family: Patient declined    Attends Religious Services: Patient declined    Database administrator or Organizations: Patient declined    Attends Banker Meetings: Not on file    Marital Status: Patient declined   Family History  Problem Relation Age of Onset   Breast cancer Maternal Aunt    Cervical cancer Maternal  Aunt    Breast cancer Maternal Aunt    BRCA 1/2 Neg Hx    Allergies  Allergen Reactions   Codeine Hives and Nausea And Vomiting   Food     Lobster-swelling like a blowfish   Hydrocodone  Hives   Latex     Instant rash, itching and bladder infection   Oxycodone  Hives and Nausea And Vomiting   Current Outpatient Medications  Medication Sig Dispense Refill   DULoxetine  (CYMBALTA ) 20 MG capsule Take 1 capsule (20 mg total) by mouth daily. 30 capsule 2   hydrOXYzine  (ATARAX ) 25 MG tablet Take 0.5-1 tablets (12.5-25 mg total) by mouth 2 (two) times daily as needed for anxiety (or sleep). 60 tablet 2   Multiple Vitamins-Minerals (MULTIVITAMIN WITH MINERALS) tablet Take 1 tablet by mouth daily.     naproxen  (NAPROSYN ) 500 MG  tablet TAKE 1 TABLET BY MOUTH THREE TIMES DAILY WITH MEALS 30 tablet 0   No current facility-administered medications for this visit.   No results found.  Review of Systems:   A ROS was performed including pertinent positives and negatives as documented in the HPI.  Physical Exam :   Constitutional: NAD and appears stated age Neurological: Alert and oriented Psych: Appropriate affect and cooperative There were no vitals taken for this visit.   Comprehensive Musculoskeletal Exam:    Right knee with 4 quadrants of lateral patellar motion.  Range of motion is from 0 degrees to 135 degrees.  There is no effusion.  There is tenderness about the MPFL distal neurosensory exam is intact   Imaging:   Xray (4 views right knee): Normal    I personally reviewed and interpreted the radiographs.   Assessment and Plan:   48 y.o. female evidence of right knee patellar instability and now sustaining a second patella dislocation.  Given this I do believe an MRI is needed to rule out any type of underlying chondral injury and to assess the status of the MPFL.  Will plan to proceed with this and I will see her back following discuss results  -Plan for MRI right knee and follow-up discuss result   I personally saw and evaluated the patient, and participated in the management and treatment plan.  Elspeth Parker, MD Attending Physician, Orthopedic Surgery  This document was dictated using Dragon voice recognition software. A reasonable attempt at proof reading has been made to minimize errors.

## 2024-05-16 NOTE — Progress Notes (Signed)
 Patient ID: Gloria Ramos, female    DOB: [birth year], 48 y.o.   MRN: [id number]  Chief Complaint  Patient presents with   Hormone replacement therapy    Pt c/o hot flashes, irritability and fatigue. Present for years. Has not tried anything in the past for sx.    Rectal Bleeding    Pt c/o rectal pain and bloody stools last week.    Nausea    Pt c/o nausea, present after starting cymbalta .   Discussed the use of AI scribe software for clinical note transcription with the patient, who gave verbal consent to proceed.  History of Present Illness   Gloria Ramos is a 48 year old female with multiple sclerosis who presents with severe vision problems, dental issues, rectal bleeding, and anxiety management.  She experiences severe vision problems, including blurry vision at night and severe pain behind both eyes lasting 30 to 40 minutes, occurring six to seven times a day. Her vision has deteriorated significantly over the past two to three weeks, making it difficult to see the television.  She has a loose tooth that is very tender and sore, making it difficult to chew. She has a history of periodontal disease and has partials in the front. She is unsure if her Medicaid covers dental services but has previously received dental care under Medicaid.  She experiences rectal bleeding with severe, sharp pain described as 'a searing hot poker' lasting about ten minutes. She has not yet had a colonoscopy despite being referred to gastroenterology, as she was unable to reach the office after multiple attempts.  She is currently taking Cymbalta  20 mg (RX thru her psych provider) and feels worse, with increased irritability and nausea. She describes feeling 'feisty' and having a desire to 'bite and punch and hurt somebody.' She also takes hydroxyzine  for anxiety and sleep, which takes three to four hours to take effect and then causes her to sleep for up to fourteen hours. She has tried adjusting the timing of  her dose without success. She requested a Benzo, but told that class is contraindicated for PTSD. She has experienced issues with her virtual therapy appointments, with the therapist not showing up for scheduled sessions. She is currently homeless and finds virtual appointments more convenient due to her living situation.     Assessment and Plan    Severe post-traumatic stress disorder (PTSD) with comorbid depression and generalized anxiety disorder Increased irritability and worsening symptoms with Cymbalta . Therapy access issues. No improvement with current medications. - Discontinue Cymbalta  due to worsening symptoms and nausea. - Attempt to reschedule therapy appointment for PTSD management.  PTSD Adverse effects of duloxetine  (Cymbalta ) Severe nausea and increased irritability. Advised discontinuation without tapering. - Discontinue Cymbalta  immediately.  Insomnia related to psychiatric medications Difficulty falling asleep with hydroxyzine . Delayed sleep onset and excessive daytime sleepiness. - Consider taking two hydroxyzine  tablets at bedtime to improve sleep onset, but caution due to excessive sleep duration. - Consider taking half a tablet during the day for anxiety management.  Severe bilateral eye pain and vision loss, etiology unclear Severe pain and worsening vision, etiology unclear, possible MS relation. - Refer to Opthalmology   Hx of crohn's disease Rectal pain and rectal bleeding, not yet evaluated. Severe rectal pain, no colonoscopy due to communication issues. - Refer to a different gastroenterology provider [facility]), to evaluate rectal pain and bleeding.  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 Uncertain Medicaid coverage for  dental care. - Refer to a dentist for evaluation and management of the loose tooth.   Encounter for screening for lung cancer 30 pack year hx of smoking. Also states had several  black spots found on xray years ago, never followed up. She is concerned as having intermittent sharp chest pains center and above her left breast. - Ambulatory Referral Lung Cancer Screening [facility]  Subjective:    Outpatient Medications Prior to Visit  Medication Sig Dispense Refill   DULoxetine  (CYMBALTA ) 20 MG capsule Take 1 capsule (20 mg total) by mouth daily. 30 capsule 2   hydrOXYzine  (ATARAX ) 25 MG tablet Take 0.5-1 tablets (12.5-25 mg total) by mouth 2 (two) times daily as needed for anxiety (or sleep). 60 tablet 2   Multiple Vitamins-Minerals (MULTIVITAMIN WITH MINERALS) tablet Take 1 tablet by mouth daily.     naproxen  (NAPROSYN ) 500 MG tablet TAKE 1 TABLET BY MOUTH THREE TIMES DAILY WITH MEALS 30 tablet 0   No facility-administered medications prior to visit.   Past Medical History:  Diagnosis Date   Anemia    Arthritis    Cervical cancer (HCC)    Colitis    Collapsed lung    Crohn's disease (HCC)    Edema    Multiple sclerosis    Pelvic pain in female 09/30/2014   PTSD (post-traumatic stress disorder)    Rheumatoid arthritis (HCC)    Past Surgical History:  Procedure Laterality Date   ABDOMINAL SURGERY     CERVICAL CONE BIOPSY  2015   TUBAL LIGATION     Allergies  Allergen Reactions   Codeine Hives and Nausea And Vomiting   Food     Lobster-swelling like a blowfish   Hydrocodone  Hives   Latex     Instant rash, itching and bladder infection   Oxycodone  Hives and Nausea And Vomiting      Objective:    Physical Exam Vitals and nursing note reviewed.  Constitutional:      Appearance: Normal appearance. She is obese.  Cardiovascular:     Rate and Rhythm: Normal rate and regular rhythm.  Pulmonary:     Effort: Pulmonary effort is normal.     Breath sounds: Normal breath sounds.  Musculoskeletal:        General: Normal range of motion.  Skin:    General: Skin is warm and dry.  Neurological:     Mental Status: She is alert.  Psychiatric:         Mood and Affect: Mood normal.        Behavior: Behavior normal.    BP 118/80 (BP Location: Left Arm, Patient Position: Sitting, Cuff Size: Large)   Pulse 70   Temp (!) 97 F (36.1 C) (Temporal)   Ht 5' 5 (1.651 m)   Wt 219 lb 3.2 oz (99.4 kg)   SpO2 96%   BMI 36.48 kg/m  Wt Readings from Last 3 Encounters:  05/16/24 219 lb 3.2 oz (99.4 kg)  02/18/24 216 lb (98 kg)  01/17/24 219 lb (99.3 kg)       Lucius Krabbe, NP

## 2024-05-16 NOTE — Addendum Note (Signed)
 Addended by: WOLFGANG CONLEY HERO on: 05/16/2024 03:20 PM   Modules accepted: Orders

## 2024-05-17 NOTE — Assessment & Plan Note (Signed)
 Rectal pain and rectal bleeding, not yet evaluated. Severe rectal pain, no colonoscopy due to communication issues. - Refer to a different gastroenterology provider [facility]), to evaluate rectal pain and bleeding.

## 2024-05-19 ENCOUNTER — Other Ambulatory Visit: Payer: Self-pay | Admitting: Family

## 2024-05-19 ENCOUNTER — Ambulatory Visit (HOSPITAL_COMMUNITY): Payer: Self-pay | Admitting: Psychiatry

## 2024-05-19 DIAGNOSIS — R0789 Other chest pain: Secondary | ICD-10-CM

## 2024-05-19 DIAGNOSIS — R0602 Shortness of breath: Secondary | ICD-10-CM
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
## 2024-05-21 ENCOUNTER — Telehealth (HOSPITAL_COMMUNITY): Admitting: Psychiatry

## 2024-05-21 ENCOUNTER — Ambulatory Visit

## 2024-05-21 ENCOUNTER — Other Ambulatory Visit

## 2024-05-21 ENCOUNTER — Encounter (HOSPITAL_COMMUNITY): Payer: Self-pay | Admitting: Psychiatry

## 2024-05-21 DIAGNOSIS — F410 Panic disorder [episodic paroxysmal anxiety] without agoraphobia: Secondary | ICD-10-CM

## 2024-05-21 DIAGNOSIS — R0789 Other chest pain: Secondary | ICD-10-CM | POA: Diagnosis not present

## 2024-05-21 DIAGNOSIS — F1290 Cannabis use, unspecified, uncomplicated: Secondary | ICD-10-CM | POA: Diagnosis not present

## 2024-05-21 DIAGNOSIS — F4310 Post-traumatic stress disorder, unspecified: Secondary | ICD-10-CM | POA: Diagnosis not present

## 2024-05-21 DIAGNOSIS — R0602 Shortness of breath: Secondary | ICD-10-CM | POA: Diagnosis not present

## 2024-05-21 DIAGNOSIS — R079 Chest pain, unspecified: Secondary | ICD-10-CM | POA: Diagnosis not present

## 2024-05-21 DIAGNOSIS — R9389 Abnormal findings on diagnostic imaging of other specified body structures: Secondary | ICD-10-CM | POA: Diagnosis not present

## 2024-05-21 MED ORDER — MIRTAZAPINE 7.5 MG PO TABS
7.5000 mg | ORAL_TABLET | Freq: Every day | ORAL | 1 refills | Status: DC
Start: 2024-05-21 — End: 2024-07-09

## 2024-05-21 MED ORDER — HYDROXYZINE HCL 25 MG PO TABS: 25.0000 mg | ORAL_TABLET | Freq: Two times a day (BID) | ORAL | 2 refills | Status: DC | PRN

## 2024-05-21 NOTE — Patient Instructions (Signed)
 Thank you for attending your appointment today.  -- START Remeron 7.5 mg nightly -- INCREASE hydroxyzine  to 50 mg nightly as needed for sleep -- Continue other medications as prescribed.  Please do not make any changes to medications without first discussing with your provider. If you are experiencing a psychiatric emergency, please call 911 or present to your nearest emergency department. Additional crisis, medication management, and therapy resources are included below.  [facility]  [address] [place], [place] [address] [phone] WALK-IN URGENT CARE 24/7 FOR ANYONE [address] [place], [place]  [phone] Fax: [phone] [email or web address] *Interpreters available *Accepts all insurance and uninsured for Urgent Care needs *Accepts Medicaid and uninsured for outpatient treatment (below)      ONLY FOR [facility]  Below:    Outpatient New Patient Assessment/Therapy Walk-ins:        Monday, Wednesday, and Thursday 8am until slots are full (first come, first served)                   New Patient Psychiatry/Medication Management        Monday-Friday 8am-11am (first come, first served)               For all walk-ins we ask that you arrive by 7:15am, because patients will be seen in the order of arrival.

## 2024-05-22 ENCOUNTER — Other Ambulatory Visit: Payer: Self-pay | Admitting: Family

## 2024-05-22 DIAGNOSIS — S41102A Unspecified open wound of left upper arm, initial encounter: Secondary | ICD-10-CM

## 2024-05-23 ENCOUNTER — Ambulatory Visit: Admitting: Neurology

## 2024-05-23 ENCOUNTER — Ambulatory Visit: Payer: Self-pay | Admitting: Family

## 2024-06-01 ENCOUNTER — Ambulatory Visit
Admission: RE | Admit: 2024-06-01 | Discharge: 2024-06-01 | Disposition: A | Discharge status: Home / Self Care | Source: Ambulatory Visit | Attending: Orthopaedic Surgery | Admitting: Orthopaedic Surgery

## 2024-06-01 DIAGNOSIS — M25561 Pain in right knee: Secondary | ICD-10-CM

## 2024-06-06 ENCOUNTER — Other Ambulatory Visit (HOSPITAL_BASED_OUTPATIENT_CLINIC_OR_DEPARTMENT_OTHER): Payer: Self-pay

## 2024-06-06 ENCOUNTER — Ambulatory Visit (INDEPENDENT_AMBULATORY_CARE_PROVIDER_SITE_OTHER): Admitting: Orthopaedic Surgery

## 2024-06-06 ENCOUNTER — Ambulatory Visit (HOSPITAL_BASED_OUTPATIENT_CLINIC_OR_DEPARTMENT_OTHER): Payer: Self-pay | Admitting: Orthopaedic Surgery

## 2024-06-06 DIAGNOSIS — M25361 Other instability, right knee: Secondary | ICD-10-CM

## 2024-06-06 MED ORDER — HYDROMORPHONE HCL 2 MG PO TABS
2.0000 mg | ORAL_TABLET | ORAL | 0 refills | Status: DC | PRN
  Filled 2024-06-06: qty 20, 4d supply, fill #0

## 2024-06-06 MED ORDER — ACETAMINOPHEN 500 MG PO TABS
500.0000 mg | ORAL_TABLET | Freq: Three times a day (TID) | ORAL | 0 refills | Status: AC
  Filled 2024-06-06: qty 30, 10d supply, fill #0

## 2024-06-06 MED ORDER — ASPIRIN 325 MG PO TBEC
325.0000 mg | DELAYED_RELEASE_TABLET | Freq: Every day | ORAL | 0 refills | Status: AC
  Filled 2024-06-06: qty 14, 14d supply, fill #0

## 2024-06-06 NOTE — Progress Notes (Signed)
 Chief Complaint: Right knee instability     History of Present Illness:   06/06/2024: Presents today for further discussion of the MRI of her right knee  Gloria Ramos is a 48 y.o. female presents today with ongoing right knee patellar instability.  She most recently experiences dislocation of the patella 2 weeks prior where the kneecap came out and she had to manually reduce this.  She does have a history of this occurring earlier this year as well which was her first initial dislocation event.  This time she is very trephinated has had experiences with this on the contralateral side as well.    PMH/PSH/Family History/Social History/Meds/Allergies:    Past Medical History:  Diagnosis Date   Anemia    Arthritis    Cervical cancer (HCC)    Colitis    Collapsed lung    Crohn's disease (HCC)    Edema    Multiple sclerosis    Pelvic pain in female 09/30/2014   PTSD (post-traumatic stress disorder)    Rheumatoid arthritis (HCC)    Past Surgical History:  Procedure Laterality Date   ABDOMINAL SURGERY     CERVICAL CONE BIOPSY  2015   TUBAL LIGATION     Social History   Socioeconomic History   Marital status: Significant Other    Spouse name: Not on file   Number of children: Not on file   Years of education: Not on file   Highest education level: Not on file  Occupational History   Not on file  Tobacco Use   Smoking status: Some Days    Current packs/day: 0.25    Types: Cigarettes   Smokeless tobacco: Never  Vaping Use   Vaping status: Some Days   Substances: Nicotine  Substance and Sexual Activity   Alcohol use: Never   Drug use: Yes    Types: Marijuana    Comment: daily   Sexual activity: Yes    Partners: Male    Birth control/protection: Surgical  Other Topics Concern   Not on file  Social History Narrative   Not on file   Social Drivers of Health   Financial Resource Strain: High Risk (05/13/2024)   Overall Financial Resource Strain (CARDIA)     Difficulty of Paying Living Expenses: Very hard  Food Insecurity: Food Insecurity Present (05/13/2024)   Hunger Vital Sign    Worried About Running Out of Food in the Last Year: Often true    Ran Out of Food in the Last Year: Often true  Transportation Needs: Unmet Transportation Needs (05/13/2024)   PRAPARE - Transportation    Lack of Transportation (Medical): Yes    Lack of Transportation (Non-Medical): Yes  Physical Activity: Unknown (05/13/2024)   Exercise Vital Sign    Days of Exercise per Week: Patient declined    Minutes of Exercise per Session: Not on file  Stress: Patient Declined (05/13/2024)   [employer] of Occupational Health - Occupational Stress Questionnaire    Feeling of Stress: Patient declined  Social Connections: Unknown (05/13/2024)   Social Connection and Isolation Panel    Frequency of Communication with Friends and Family: Patient declined    Frequency of Social Gatherings with Friends and Family: Patient declined    Attends Religious Services: Patient declined    Database administrator or Organizations: Patient declined    Attends Banker Meetings: Not on file    Marital Status: Patient declined   Family History  Problem Relation Age of  Onset   Breast cancer Maternal Aunt    Cervical cancer Maternal Aunt    Breast cancer Maternal Aunt    BRCA 1/2 Neg Hx    Allergies  Allergen Reactions   Codeine Hives and Nausea And Vomiting   Food     Lobster-swelling like a blowfish   Hydrocodone  Hives   Latex     Instant rash, itching and bladder infection   Oxycodone  Hives and Nausea And Vomiting   Current Outpatient Medications  Medication Sig Dispense Refill   acetaminophen  (TYLENOL ) 500 MG tablet Take 1 tablet (500 mg total) by mouth every 8 (eight) hours for 10 days. 30 tablet 0   aspirin EC 325 MG tablet Take 1 tablet (325 mg total) by mouth daily. 14 tablet 0   HYDROmorphone  (DILAUDID ) 2 MG tablet Take 1 tablet (2 mg total) by mouth  every 4 (four) hours as needed for severe pain (pain score 7-10). 20 tablet 0   hydrOXYzine  (ATARAX ) 25 MG tablet Take 1-2 tablets (25-50 mg total) by mouth 2 (two) times daily as needed for anxiety (or sleep). 60 tablet 2   mirtazapine (REMERON) 7.5 MG tablet Take 1 tablet (7.5 mg total) by mouth at bedtime. 30 tablet 1   Multiple Vitamins-Minerals (MULTIVITAMIN WITH MINERALS) tablet Take 1 tablet by mouth daily.     naproxen  (NAPROSYN ) 500 MG tablet TAKE 1 TABLET BY MOUTH THREE TIMES DAILY WITH MEALS 30 tablet 0   No current facility-administered medications for this visit.   No results found.  Review of Systems:   A ROS was performed including pertinent positives and negatives as documented in the HPI.  Physical Exam :   Constitutional: NAD and appears stated age Neurological: Alert and oriented Psych: Appropriate affect and cooperative There were no vitals taken for this visit.   Comprehensive Musculoskeletal Exam:    Right knee with 4 quadrants of lateral patellar motion.  Range of motion is from 0 degrees to 135 degrees.  There is no effusion.  There is tenderness about the MPFL distal neurosensory exam is intact   Imaging:   Xray (4 views right knee): Normal  MRI right knee: Disruption of the medial patellofemoral ligament  I personally reviewed and interpreted the radiographs.   Assessment and Plan:   48 y.o. female evidence of right knee patellar instability and now sustaining a second patella dislocation.  Had multiple instances of instability at this time and as result is hoping to proceed with surgical option for stabilization.  I did discuss that to me her MRI does not show evidence of chondral involvement and as a result I do believe she would benefit from an MPFL reconstruction.  I did discuss risk limitation as well as associated recovery timeframe.  After discussion she would like to proceed  -Plan for right knee medial patellofemoral ligament  reconstruction   After a lengthy discussion of treatment options, including risks, benefits, alternatives, complications of surgical and nonsurgical conservative options, the patient elected surgical repair.   The patient  is aware of the material risks  and complications including, but not limited to injury to adjacent structures, neurovascular injury, infection, numbness, bleeding, implant failure, thermal burns, stiffness, persistent pain, failure to heal, disease transmission from allograft, need for further surgery, dislocation, anesthetic risks, blood clots, risks of death,and others. The probabilities of surgical success and failure discussed with patient given their particular co-morbidities.The time and nature of expected rehabilitation and recovery was discussed.The patient's questions were all answered preoperatively.  No barriers to understanding were noted. I explained the natural history of the disease process and Rx rationale.  I explained to the patient what I considered to be reasonable expectations given their personal situation.  The final treatment plan was arrived at through a shared patient decision making process model.    I personally saw and evaluated the patient, and participated in the management and treatment plan.  Elspeth Parker, MD Attending Physician, Orthopedic Surgery  This document was dictated using Dragon voice recognition software. A reasonable attempt at proof reading has been made to minimize errors.

## 2024-06-23 ENCOUNTER — Encounter: Payer: Self-pay | Admitting: Radiology

## 2024-07-03 ENCOUNTER — Encounter: Payer: Self-pay | Admitting: Family

## 2024-07-03 ENCOUNTER — Other Ambulatory Visit: Payer: Self-pay | Admitting: Family

## 2024-07-03 DIAGNOSIS — S41102A Unspecified open wound of left upper arm, initial encounter: Secondary | ICD-10-CM
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
## 2024-07-09 ENCOUNTER — Telehealth (INDEPENDENT_AMBULATORY_CARE_PROVIDER_SITE_OTHER): Admitting: Psychiatry

## 2024-07-09 ENCOUNTER — Encounter (HOSPITAL_COMMUNITY): Payer: Self-pay | Admitting: Psychiatry

## 2024-07-09 DIAGNOSIS — F410 Panic disorder [episodic paroxysmal anxiety] without agoraphobia: Secondary | ICD-10-CM

## 2024-07-09 DIAGNOSIS — F1290 Cannabis use, unspecified, uncomplicated: Secondary | ICD-10-CM

## 2024-07-09 DIAGNOSIS — N959 Unspecified menopausal and perimenopausal disorder: Secondary | ICD-10-CM | POA: Diagnosis not present

## 2024-07-09 DIAGNOSIS — F4310 Post-traumatic stress disorder, unspecified: Secondary | ICD-10-CM | POA: Diagnosis not present

## 2024-07-09 MED ORDER — MIRTAZAPINE 15 MG PO TABS: 15.0000 mg | ORAL_TABLET | Freq: Every day | ORAL | 2 refills | Status: AC

## 2024-07-09 MED ORDER — HYDROXYZINE HCL 25 MG PO TABS: 25.0000 mg | ORAL_TABLET | Freq: Two times a day (BID) | ORAL | 2 refills | Status: AC | PRN

## 2024-07-09 NOTE — Patient Instructions (Signed)
 Thank you for attending your appointment today.  -- INCREASE Remeron to 15 mg nightly -- Continue other medications as prescribed.  Please do not make any changes to medications without first discussing with your provider. If you are experiencing a psychiatric emergency, please call 911 or present to your nearest emergency department. Additional crisis, medication management, and therapy resources are included below.  [facility]  [address], [place], [place] [address] [phone] WALK-IN URGENT CARE 24/7 FOR ANYONE [address], [place], [place]  [phone] Fax: [phone] [email or web address] *Interpreters available *Accepts all insurance and uninsured for Urgent Care needs *Accepts Medicaid and uninsured for outpatient treatment (below)      ONLY FOR [facility]  Below:    Outpatient New Patient Assessment/Therapy Walk-ins:        Monday, Wednesday, and Thursday 8am until slots are full (first come, first served)                   New Patient Psychiatry/Medication Management        Monday-Friday 8am-11am (first come, first served)               For all walk-ins we ask that you arrive by 7:15am, because patients will be seen in the order of arrival.

## 2024-07-22 ENCOUNTER — Ambulatory Visit: Admitting: Neurology

## 2024-07-22 ENCOUNTER — Encounter: Payer: Self-pay | Admitting: Neurology

## 2024-07-22 VITALS — BP 125/86 | HR 102 | Ht 65.0 in | Wt 226.0 lb

## 2024-07-22 DIAGNOSIS — R202 Paresthesia of skin: Secondary | ICD-10-CM | POA: Diagnosis not present

## 2024-07-22 DIAGNOSIS — G35D Multiple sclerosis, unspecified: Secondary | ICD-10-CM

## 2024-07-22 DIAGNOSIS — G43709 Chronic migraine without aura, not intractable, without status migrainosus: Secondary | ICD-10-CM

## 2024-07-22 DIAGNOSIS — M542 Cervicalgia: Secondary | ICD-10-CM | POA: Diagnosis not present

## 2024-07-22 MED ORDER — AIMOVIG 140 MG/ML ~~LOC~~ SOAJ: 140.0000 mg | SUBCUTANEOUS | 5 refills | Status: AC

## 2024-07-22 MED ORDER — ONDANSETRON 4 MG PO TBDP: 4.0000 mg | ORAL_TABLET | Freq: Three times a day (TID) | ORAL | 5 refills | Status: AC | PRN

## 2024-07-22 MED ORDER — SUMATRIPTAN SUCCINATE 50 MG PO TABS: 50.0000 mg | ORAL_TABLET | ORAL | 5 refills | Status: AC | PRN

## 2024-07-22 NOTE — Progress Notes (Signed)
 NEUROLOGY CONSULTATION NOTE  Gloria Ramos MRN: [id number] DOB: September 25, 1975  Referring provider: Corean Comment, NP Primary care provider: Corean Comment, NP  Reason for consult:  multiple sclerosis  Assessment/Plan:   Reported history of multiple sclerosis Paresthesias:  diffuse paresthesias not consistent with a peripheral neuropathy.  Possibly B12 deficiency as levels less than 400 may be symptomatic.  However, the electric/shock like pain on the bottom of her feet may be peripheral neuropathy. Chronic migraine without aura, without status migrainosus, intractable, complicated by medication overuse Chronic neck pain   MRI of brain and cervical spine with and without contrast for follow up on MS status. NCV-EMG of lower extremities to evaluate for peripheral neuropathy Advised to start B12 1000mcg daily to see if paresthesias improve. Headache management: Migraine prevention:  Plan to start Aimovig  140mg  every 28 days Migraine rescues:  She will try sumatriptan  50mg , otherwise may use naproxen  as it is effective.  Zofran  4mg  for nausea Discussed rebound headaches and general rule to limit use of pain relievers to no more than 9 days out of the month.  This is not possible, however, given her chronic pain Discussed vitamins/supplements option Hydration/diet/exercise/sleep hygiene Continue management of anxiety and depression as per PCP Follow up 7 months.   Total time spent in chart and face to face with patient:  76 minutes  Subjective:  Discussed the use of AI scribe software for clinical note transcription with the patient, who gave verbal consent to proceed.  History of Present Illness Gloria Ramos is a 48 year old female with multiple sclerosis, Crohn's disease, PTSD, anxiety and rheumatoid arthritis who presents for multiple sclerosis.  She is accompanied by her daughter.  History supplemented by referring provider's notes.  She has experienced a persistent  sensation of 'bugs' crawling on her skin, primarily starting in her feet and progressing to her legs, arms, torso, and face over the past several years. This sensation is constant and exacerbated by certain fabrics. She also experiences numbness and tingling in her arms and hands, describing her condition as debilitating, causing fatigue and weakness. No pain is associated with the 'bug' sensation.  She also endorses sharp shooting pain on the bottom of her feet, particularly when she is standing.  Symptoms have been worse over the past 6 months  She also has severe pain behind her eyes, with blurred vision occurring at night for the past four to five weeks. The blurred vision worsens after 8 PM, and she is unable to see clearly even with glasses. She has not had an eye exam recently. She experiences severe headaches with sharp, shooting pains behind her eyes and in her temples, occurring every couple of days and lasting 5-10 minutes. The top of her head is extremely tender, and she cannot touch it without pain.   She has a history of migraines since her teens, which have worsened since an epidural during childbirth caused spinal fluid leakage. These migraines are associated with nausea, sensitivity to light and sound, and can last 2-3 days without treatment. They are daily.  She has taken daily Goody Powders for years.  She has typically taken naproxen  daily for headache relief as well as for her other chronic pain, but has not been able to pick it up for over a week and recently resumed using Goody Powders due to lack of naproxen .  She reports chronic neck pain with a crunching sensation when moving her neck side to side. She has not had recent imaging or  treatment for her neck but previously found relief with chiropractic care.  She has history of carpal tunnel syndrome.  She was diagnosed with MS by her previous PCP in [place]  in 2010-2011 but has not been on any specific MS treatment. She has had MRIs  and CT scans in the past but no spinal tap. She was never started on DMT by her PCP due to concerns of potential side effects.  She has a family history of MS on her mother's side.  She has a history of rheumatoid arthritis and osteoarthritis, also diagnosed by her previous PCP in [place] .  She has chronic pain in her joints.  She was never started on immunosuppressant therapy for her RA because her PCP was concerned that the side effects may outweigh the benefits.  She has a family history of both conditions on her mother's side.  She is currently homeless and has been struggling to manage her health conditions due to loss of disability benefits and Medicaid. She is working on regaining these benefits.  Current medications:  naproxen  500mg , mirtazapine  15mg  at bedtime, hydroxyzine , Anastacia Past medications:  duloxetine  (caused agitation), Depakote, hydromorphone , ASA  05/13/2024 LABS: B12 320; TSH 1.940; Hgb A1c 5.4; vit D 33.7; urine cannabinoids positive   PAST MEDICAL HISTORY: Past Medical History:  Diagnosis Date   Anemia    Arthritis    Cervical cancer (HCC)    Colitis    Collapsed lung    Crohn's disease (HCC)    Edema    Multiple sclerosis    Pelvic pain in female 09/30/2014   PTSD (post-traumatic stress disorder)    Rheumatoid arthritis (HCC)     PAST SURGICAL HISTORY: Past Surgical History:  Procedure Laterality Date   ABDOMINAL SURGERY     CERVICAL CONE BIOPSY  2015   TUBAL LIGATION      MEDICATIONS: Current Outpatient Medications on File Prior to Visit  Medication Sig Dispense Refill   aspirin  EC 325 MG tablet Take 1 tablet (325 mg total) by mouth daily. 14 tablet 0   HYDROmorphone  (DILAUDID ) 2 MG tablet Take 1 tablet (2 mg total) by mouth every 4 (four) hours as needed for severe pain (pain score 7-10). 20 tablet 0   hydrOXYzine  (ATARAX ) 25 MG tablet Take 1-2 tablets (25-50 mg total) by mouth 2 (two) times daily as needed for anxiety (or sleep). 60 tablet 2    mirtazapine  (REMERON ) 15 MG tablet Take 1 tablet (15 mg total) by mouth at bedtime. 30 tablet 2   Multiple Vitamins-Minerals (MULTIVITAMIN WITH MINERALS) tablet Take 1 tablet by mouth daily.     naproxen  (NAPROSYN ) 500 MG tablet TAKE 1 TABLET BY MOUTH THREE TIMES DAILY WITH MEALS 30 tablet 0   No current facility-administered medications on file prior to visit.    ALLERGIES: Allergies  Allergen Reactions   Shellfish Allergy Swelling   Codeine Hives and Nausea And Vomiting   Hydrocodone  Hives   Latex     Instant rash, itching and bladder infection   Oxycodone  Hives and Nausea And Vomiting    FAMILY HISTORY: Family History  Problem Relation Age of Onset   Breast cancer Maternal Aunt    Cervical cancer Maternal Aunt    Breast cancer Maternal Aunt    BRCA 1/2 Neg Hx     Objective:  Blood pressure 125/86, pulse (!) 102, height 5' 5 (1.651 m), weight 226 lb (102.5 kg), SpO2 97%. General: No acute distress.  Patient appears well-groomed.   Head:  Normocephalic/atraumatic  Eyes:  fundi examined but not visualized Neck: supple, bilateral paraspinal tenderness, full range of motion Heart: regular rate and rhythm Neurological Exam: Mental status: alert and oriented to person, place, and time, speech fluent and not dysarthric, language intact. Cranial nerves: CN I: not tested CN II: pupils equal, round and reactive to light, visual fields intact CN III, IV, VI:  full range of motion, no nystagmus, no ptosis CN V: facial sensation intact. CN VII: upper and lower face symmetric CN VIII: hearing intact CN IX, X: gag intact, uvula midline CN XI: sternocleidomastoid and trapezius muscles intact CN XII: tongue midline Bulk & Tone: normal, no fasciculations. Motor:  muscle strength 5/5 throughout Sensation:  Pinprick and vibratory sensation intact. Deep Tendon Reflexes:  2+ throughout,  toes downgoing.   Finger to nose testing:  Without dysmetria.   Gait:  Normal station and stride.   Romberg negative.    Thank you for allowing me to take part in the care of this patient.  Juliene Dunnings, DO  CC: Corean Comment, NP

## 2024-07-22 NOTE — Patient Instructions (Addendum)
  Check MRI of brain and cervical spine with and without contrast. We have sent a referral to [facility] for your MRI and they will call you directly to schedule your appointment. They are located at [address] [facility]. If you need to contact them directly please call [phone].  Nerve conduction study of bilateral legs. ELECTROMYOGRAM AND NERVE CONDUCTION STUDIES (EMG/NCS) INSTRUCTIONS  How to Prepare The neurologist conducting the EMG will need to know if you have certain medical conditions. Tell the neurologist and other EMG lab personnel if you: Have a pacemaker or any other electrical medical device Take blood-thinning medications Have hemophilia, a blood-clotting disorder that causes prolonged bleeding Bathing Take a shower or bath shortly before your exam in order to remove oils from your skin. Don't apply lotions or creams before the exam.  What to Expect You'll likely be asked to change into a hospital gown for the procedure and lie down on an examination table. The following explanations can help you understand what will happen during the exam.  Electrodes. The neurologist or a technician places surface electrodes at various locations on your skin depending on where you're experiencing symptoms. Or the neurologist may insert needle electrodes at different sites depending on your symptoms.  Sensations. The electrodes will at times transmit a tiny electrical current that you may feel as a twinge or spasm. The needle electrode may cause discomfort or pain that usually ends shortly after the needle is removed. If you are concerned about discomfort or pain, you may want to talk to the neurologist about taking a short break during the exam.  Instructions. During the needle EMG, the neurologist will assess whether there is any spontaneous electrical activity when the muscle is at rest - activity that isn't present in healthy muscle tissue - and the degree of activity when you slightly  contract the muscle.  He or she will give you instructions on resting and contracting a muscle at appropriate times. Depending on what muscles and nerves the neurologist is examining, he or she may ask you to change positions during the exam.  After your EMG You may experience some temporary, minor bruising where the needle electrode was inserted into your muscle. This bruising should fade within several days. If it persists, contact your primary care doctor.   Take B12 1000mcg daily to see if the creepy crawly symptoms resolve Start Aimovig 140mg  injection every 28 days.  Contact us  in 3 months. Take sumatriptan 50mg  at earliest onset of headache.  May repeat dose once in 2 hours if needed.  Maximum 2 tablets in 24 hours.  Otherwise, may use naproxen . Take ondansetron  for nausea STOP GOODY. Limit use of pain relievers to no more than 9 days out of the month.  These medications include acetaminophen , NSAIDs (ibuprofen /Advil /Motrin , naproxen /Aleve , triptans (Imitrex/sumatriptan), Excedrin, and narcotics.  This will help reduce risk of rebound headaches. Be aware of common food triggers Routine exercise Stay adequately hydrated (aim for 64 oz water daily) Keep headache diary Maintain proper stress management Maintain proper sleep hygiene Do not skip meals Consider supplements:  MigreLief pill twice daily

## 2024-07-24 ENCOUNTER — Other Ambulatory Visit: Payer: Self-pay

## 2024-07-24 ENCOUNTER — Encounter (HOSPITAL_BASED_OUTPATIENT_CLINIC_OR_DEPARTMENT_OTHER): Payer: Self-pay | Admitting: Orthopaedic Surgery

## 2024-07-29 ENCOUNTER — Other Ambulatory Visit: Payer: Self-pay

## 2024-07-29 ENCOUNTER — Encounter (HOSPITAL_BASED_OUTPATIENT_CLINIC_OR_DEPARTMENT_OTHER): Admission: RE | Disposition: A | Payer: Self-pay | Source: Home / Self Care | Attending: Orthopaedic Surgery

## 2024-07-29 ENCOUNTER — Ambulatory Visit (HOSPITAL_BASED_OUTPATIENT_CLINIC_OR_DEPARTMENT_OTHER)

## 2024-07-29 ENCOUNTER — Ambulatory Visit (HOSPITAL_BASED_OUTPATIENT_CLINIC_OR_DEPARTMENT_OTHER): Admitting: Anesthesiology

## 2024-07-29 ENCOUNTER — Ambulatory Visit (HOSPITAL_BASED_OUTPATIENT_CLINIC_OR_DEPARTMENT_OTHER)
Admission: RE | Admit: 2024-07-29 | Discharge: 2024-07-29 | Disposition: A | Attending: Orthopaedic Surgery | Admitting: Orthopaedic Surgery

## 2024-07-29 ENCOUNTER — Telehealth: Payer: Self-pay | Admitting: Pharmacy Technician

## 2024-07-29 ENCOUNTER — Encounter (HOSPITAL_BASED_OUTPATIENT_CLINIC_OR_DEPARTMENT_OTHER): Payer: Self-pay | Admitting: Orthopaedic Surgery

## 2024-07-29 ENCOUNTER — Ambulatory Visit (HOSPITAL_COMMUNITY): Admitting: Clinical

## 2024-07-29 DIAGNOSIS — G8918 Other acute postprocedural pain: Secondary | ICD-10-CM | POA: Diagnosis not present

## 2024-07-29 DIAGNOSIS — M25361 Other instability, right knee: Secondary | ICD-10-CM

## 2024-07-29 DIAGNOSIS — Z01818 Encounter for other preprocedural examination: Secondary | ICD-10-CM

## 2024-07-29 HISTORY — PX: MEDIAL PATELLOFEMORAL LIGAMENT REPAIR: SHX2020

## 2024-07-29 LAB — POCT PREGNANCY, URINE: Preg Test, Ur: NEGATIVE

## 2024-07-29 SURGERY — RECONSTRUCTION, LIGAMENT, MEDIAL PATELLOFEMORAL
Anesthesia: General | Site: Knee | Laterality: Right

## 2024-07-29 MED ORDER — MIDAZOLAM HCL (PF) 2 MG/2ML IJ SOLN
2.0000 mg | Freq: Once | INTRAMUSCULAR | Status: AC
  Administered 2024-07-29: 2 mg via INTRAVENOUS

## 2024-07-29 MED ORDER — FENTANYL CITRATE (PF) 100 MCG/2ML IJ SOLN
INTRAMUSCULAR | Status: AC
  Filled 2024-07-29: qty 2

## 2024-07-29 MED ORDER — MIDAZOLAM HCL 2 MG/2ML IJ SOLN
INTRAMUSCULAR | Status: AC
  Filled 2024-07-29: qty 2

## 2024-07-29 MED ORDER — ROCURONIUM BROMIDE 10 MG/ML (PF) SYRINGE
PREFILLED_SYRINGE | INTRAVENOUS | Status: DC | PRN
  Administered 2024-07-29: 40 mg via INTRAVENOUS

## 2024-07-29 MED ORDER — ROCURONIUM BROMIDE 10 MG/ML (PF) SYRINGE
PREFILLED_SYRINGE | INTRAVENOUS | Status: AC
  Filled 2024-07-29: qty 10

## 2024-07-29 MED ORDER — ACETAMINOPHEN 500 MG PO TABS
ORAL_TABLET | ORAL | Status: AC
  Filled 2024-07-29: qty 2

## 2024-07-29 MED ORDER — TRANEXAMIC ACID-NACL 1000-0.7 MG/100ML-% IV SOLN
INTRAVENOUS | Status: AC
  Filled 2024-07-29: qty 100

## 2024-07-29 MED ORDER — BUPIVACAINE HCL (PF) 0.25 % IJ SOLN
INTRAMUSCULAR | Status: AC
  Filled 2024-07-29: qty 30

## 2024-07-29 MED ORDER — ONDANSETRON HCL 4 MG/2ML IJ SOLN
INTRAMUSCULAR | Status: AC
  Filled 2024-07-29: qty 2

## 2024-07-29 MED ORDER — DEXAMETHASONE SOD PHOSPHATE PF 10 MG/ML IJ SOLN
INTRAMUSCULAR | Status: DC | PRN
  Administered 2024-07-29: 10 mg via INTRAVENOUS

## 2024-07-29 MED ORDER — TRANEXAMIC ACID-NACL 1000-0.7 MG/100ML-% IV SOLN
1000.0000 mg | INTRAVENOUS | Status: AC
  Administered 2024-07-29: 1000 mg via INTRAVENOUS

## 2024-07-29 MED ORDER — 0.9 % SODIUM CHLORIDE (POUR BTL) OPTIME
TOPICAL | Status: DC | PRN
  Administered 2024-07-29: 1000 mL

## 2024-07-29 MED ORDER — LIDOCAINE 2% (20 MG/ML) 5 ML SYRINGE
INTRAMUSCULAR | Status: DC | PRN
  Administered 2024-07-29: 20 mg via INTRAVENOUS

## 2024-07-29 MED ORDER — CEFAZOLIN SODIUM-DEXTROSE 2-4 GM/100ML-% IV SOLN
INTRAVENOUS | Status: AC
  Filled 2024-07-29: qty 100

## 2024-07-29 MED ORDER — MIDAZOLAM HCL (PF) 2 MG/2ML IJ SOLN
INTRAMUSCULAR | Status: DC | PRN
  Administered 2024-07-29: 1 mg via INTRAVENOUS

## 2024-07-29 MED ORDER — LACTATED RINGERS IV SOLN: INTRAVENOUS | Status: DC

## 2024-07-29 MED ORDER — FENTANYL CITRATE (PF) 100 MCG/2ML IJ SOLN
100.0000 ug | Freq: Once | INTRAMUSCULAR | Status: AC
  Administered 2024-07-29: 100 ug via INTRAVENOUS

## 2024-07-29 MED ORDER — FENTANYL CITRATE (PF) 100 MCG/2ML IJ SOLN: 25.0000 ug | INTRAMUSCULAR | Status: DC | PRN

## 2024-07-29 MED ORDER — FENTANYL CITRATE (PF) 100 MCG/2ML IJ SOLN
INTRAMUSCULAR | Status: DC | PRN
  Administered 2024-07-29: 50 ug via INTRAVENOUS

## 2024-07-29 MED ORDER — LIDOCAINE 2% (20 MG/ML) 5 ML SYRINGE
INTRAMUSCULAR | Status: AC
  Filled 2024-07-29: qty 5

## 2024-07-29 MED ORDER — CEFAZOLIN SODIUM-DEXTROSE 2-4 GM/100ML-% IV SOLN
2.0000 g | INTRAVENOUS | Status: AC
  Administered 2024-07-29: 2 g via INTRAVENOUS

## 2024-07-29 MED ORDER — BUPIVACAINE-EPINEPHRINE (PF) 0.5% -1:200000 IJ SOLN
INTRAMUSCULAR | Status: DC | PRN
  Administered 2024-07-29: 30 mL via PERINEURAL

## 2024-07-29 MED ORDER — SUCCINYLCHOLINE CHLORIDE 200 MG/10ML IV SOSY
PREFILLED_SYRINGE | INTRAVENOUS | Status: DC | PRN
  Administered 2024-07-29: 140 mg via INTRAVENOUS

## 2024-07-29 MED ORDER — GABAPENTIN 300 MG PO CAPS
ORAL_CAPSULE | ORAL | Status: AC
  Filled 2024-07-29: qty 1

## 2024-07-29 MED ORDER — ONDANSETRON HCL 4 MG/2ML IJ SOLN
INTRAMUSCULAR | Status: DC | PRN
  Administered 2024-07-29 (×2): 4 mg via INTRAVENOUS

## 2024-07-29 MED ORDER — GABAPENTIN 300 MG PO CAPS
300.0000 mg | ORAL_CAPSULE | Freq: Once | ORAL | Status: AC
  Administered 2024-07-29: 300 mg via ORAL

## 2024-07-29 MED ORDER — PHENYLEPHRINE 80 MCG/ML (10ML) SYRINGE FOR IV PUSH (FOR BLOOD PRESSURE SUPPORT)
PREFILLED_SYRINGE | INTRAVENOUS | Status: DC | PRN
  Administered 2024-07-29 (×3): 80 ug via INTRAVENOUS

## 2024-07-29 MED ORDER — SUGAMMADEX SODIUM 200 MG/2ML IV SOLN
INTRAVENOUS | Status: DC | PRN
  Administered 2024-07-29: 225 mg via INTRAVENOUS

## 2024-07-29 MED ORDER — ACETAMINOPHEN 500 MG PO TABS
1000.0000 mg | ORAL_TABLET | Freq: Once | ORAL | Status: AC
  Administered 2024-07-29: 1000 mg via ORAL

## 2024-07-29 MED ORDER — DROPERIDOL 2.5 MG/ML IJ SOLN: 0.6250 mg | Freq: Once | INTRAMUSCULAR | Status: DC | PRN

## 2024-07-29 MED ORDER — SUCCINYLCHOLINE CHLORIDE 200 MG/10ML IV SOSY
PREFILLED_SYRINGE | INTRAVENOUS | Status: AC
  Filled 2024-07-29: qty 10

## 2024-07-29 MED ORDER — PROPOFOL 10 MG/ML IV BOLUS
INTRAVENOUS | Status: DC | PRN
  Administered 2024-07-29: 200 mg via INTRAVENOUS
  Administered 2024-07-29: 150 ug/kg/min via INTRAVENOUS

## 2024-07-29 MED ORDER — CLONIDINE HCL (ANALGESIA) 100 MCG/ML EP SOLN
EPIDURAL | Status: DC | PRN
  Administered 2024-07-29: 50 ug

## 2024-07-29 SURGICAL SUPPLY — 48 items
ANCH JUGGERLOOP OC 2.9 MB SUT (Anchor) IMPLANT
ANCHOR SUT QUATTRO KNTLS 4.5 (Anchor) IMPLANT
BLADE SAW THK.89X75X18XSGTL (BLADE) IMPLANT
BNDG COMPR ESMARK 6X3 LF (GAUZE/BANDAGES/DRESSINGS) IMPLANT
CHLORAPREP W/TINT 26 (MISCELLANEOUS) ×1 IMPLANT
CLSR STERI-STRIP ANTIMIC 1/2X4 (GAUZE/BANDAGES/DRESSINGS) IMPLANT
COOLER ICEMAN CLASSIC (MISCELLANEOUS) ×1 IMPLANT
COVER BACK TABLE 60X90IN (DRAPES) ×1 IMPLANT
CUFF TOURN SGL QUICK 42 (TOURNIQUET CUFF) IMPLANT
DERMABOND ADVANCED .7 DNX12 (GAUZE/BANDAGES/DRESSINGS) IMPLANT
DRAPE EXTREMITY T 121X128X90 (DISPOSABLE) ×1 IMPLANT
DRAPE INCISE IOBAN 66X45 STRL (DRAPES) IMPLANT
DRAPE U-SHAPE 47X51 STRL (DRAPES) ×1 IMPLANT
DRSG AQUACEL AG ADV 3.5X 6 (GAUZE/BANDAGES/DRESSINGS) IMPLANT
DRSG AQUACEL AG ADV 3.5X10 (GAUZE/BANDAGES/DRESSINGS) IMPLANT
ELECTRODE REM PT RTRN 9FT ADLT (ELECTROSURGICAL) ×1 IMPLANT
GAUZE PAD ABD 8X10 STRL (GAUZE/BANDAGES/DRESSINGS) IMPLANT
GAUZE SPONGE 4X4 12PLY STRL (GAUZE/BANDAGES/DRESSINGS) ×1 IMPLANT
GAUZE XEROFORM 1X8 LF (GAUZE/BANDAGES/DRESSINGS) IMPLANT
GLOVE BIOGEL PI IND STRL 6.5 (GLOVE) ×1 IMPLANT
GLOVE BIOGEL PI IND STRL 8 (GLOVE) ×1 IMPLANT
GLOVE SURG SS PI 6.0 STRL IVOR (GLOVE) IMPLANT
GLOVE SURG SS PI 6.5 STRL IVOR (GLOVE) IMPLANT
GLOVE SURG SS PI 8.0 STRL IVOR (GLOVE) IMPLANT
GLOVE SURG SYN 7.5 PF PI (GLOVE) IMPLANT
GOWN STRL REUS W/ TWL LRG LVL3 (GOWN DISPOSABLE) ×2 IMPLANT
GOWN STRL REUS W/ TWL XL LVL3 (GOWN DISPOSABLE) ×1 IMPLANT
GRAFT TISS 230-320 GRACILIS (Bone Implant) IMPLANT
IMMOBILIZER KNEE 22 UNIV (SOFTGOODS) IMPLANT
KIT JUGGERKNOT DISP 2.9MM (KITS) IMPLANT
KIT TURNOVER KIT B (KITS) ×1 IMPLANT
MANIFOLD NEPTUNE II (INSTRUMENTS) ×1 IMPLANT
PACK BASIN DAY SURGERY FS (CUSTOM PROCEDURE TRAY) ×1 IMPLANT
PAD CAST 4YDX4 CTTN HI CHSV (CAST SUPPLIES) IMPLANT
PAD COLD SHLDR WRAP-ON (PAD) ×1 IMPLANT
PENCIL SMOKE EVACUATOR (MISCELLANEOUS) ×1 IMPLANT
SLEEVE SCD COMPRESS KNEE MED (STOCKING) ×1 IMPLANT
SOLN 0.9% NACL POUR BTL 1000ML (IV SOLUTION) ×1 IMPLANT
SUCTION TUBE FRAZIER 10FR DISP (SUCTIONS) ×1 IMPLANT
SUT ETHILON 3 0 PS 1 (SUTURE) IMPLANT
SUT MNCRL AB 3-0 PS2 27 (SUTURE) IMPLANT
SUT TAPE ACTIVBRAID 1.5 BL (SUTURE) IMPLANT
SUT VIC AB 0 CT1 27XBRD ANBCTR (SUTURE) ×2 IMPLANT
SUT VIC AB 2-0 CT1 TAPERPNT 27 (SUTURE) ×2 IMPLANT
SUTR EXPRESS BRAID GRAFT MANIP (SUTURE) IMPLANT
SUTURE BROADBAND MINI LP BLACK (SUTURE) IMPLANT
TOWEL GREEN STERILE FF (TOWEL DISPOSABLE) ×4 IMPLANT
TUBE SUCTION HIGH CAP CLEAR NV (SUCTIONS) ×1 IMPLANT

## 2024-07-29 NOTE — Discharge Instructions (Addendum)
 Discharge Instructions    Attending Surgeon: Elspeth Parker, MD Office Phone Number: [phone]   Diagnosis and Procedures:    Surgeries Performed: Right knee patella stabilization  Discharge Plan:    Diet: Resume usual diet. Begin with light or bland foods.  Drink plenty of fluids.  Activity:  Weight bearing as tolerated right leg in brace. You are advised to go home directly from the hospital or surgical center. Restrict your activities.  GENERAL INSTRUCTIONS: 1.  Please apply ice to your wound to help with swelling and inflammation. This will improve your comfort and your overall recovery following surgery.     2. Please call Dr. Danetta office at [phone] with questions Monday-Friday during business hours. If no one answers, please leave a message and someone should get back to the patient within 24 hours. For emergencies please call 911 or proceed to the emergency room.   3. Patient to notify surgical team if experiences any of the following: Bowel/Bladder dysfunction, uncontrolled pain, nerve/muscle weakness, incision with increased drainage or redness, nausea/vomiting and Fever greater than 101.0 F.  Be alert for signs of infection including redness, streaking, odor, fever or chills. Be alert for excessive pain or bleeding and notify your surgeon immediately.  WOUND INSTRUCTIONS:   Leave your dressing, cast, or splint in place until your post operative visit.  Keep it clean and dry.  Always keep the incision clean and dry until the staples/sutures are removed. If there is no drainage from the incision you should keep it open to air. If there is drainage from the incision you must keep it covered at all times until the drainage stops  Do not soak in a bath tub, hot tub, pool, lake or other body of water until 21 days after your surgery and your incision is completely dry and healed.  If you have removable sutures (or staples) they must be removed 10-14 days  (unless otherwise instructed) from the day of your surgery.     1)  Elevate the extremity as much as possible.  2)  Keep the dressing clean and dry.  3)  Please call us  if the dressing becomes wet or dirty.  4)  If you are experiencing worsening pain or worsening swelling, please call.     MEDICATIONS: Resume all previous home medications at the previous prescribed dose and frequency unless otherwise noted Start taking the  pain medications on an as-needed basis as prescribed  Please taper down pain medication over the next week following surgery.  Ideally you should not require a refill of any narcotic pain medication.  Take pain medication with food to minimize nausea. In addition to the prescribed pain medication, you may take over-the-counter pain relievers such as Tylenol .  Do NOT take additional tylenol  if your pain medication already has tylenol  in it.  Aspirin  325mg  daily per instructions on bottle. Narcotic policy: Per [facility] clinic policy, our goal is ensure optimal postoperative pain control with a multimodal pain management strategy. For all [facility] patients, our goal is to wean post-operative narcotic medications by 6 weeks post-operatively, and many times sooner. If this is not possible due to utilization of pain medication prior to surgery, your [facility] doctor will support your acute post-operative pain control for the first 6 weeks postoperatively, with a plan to transition you back to your primary pain team following that. Maralee will work to ensure a therapist, occupational.       FOLLOWUP INSTRUCTIONS: 1. Follow up at the  Physical Therapy Clinic 3-4 days following surgery. This appointment should be scheduled unless other arrangements have been made.The Physical Therapy scheduling number is [phone] if an appointment has not already been arranged.  2. Contact Dr. Danetta office during office hours at [phone] or the practice after hours line at [phone] for  non-emergencies. For medical emergencies call 911.   Discharge Location: Home    Regional Anesthesia Blocks  1. You may not be able to move or feel the blocked extremity after a regional anesthetic block. This may last may last from 3-48 hours after placement, but it will go away. The length of time depends on the medication injected and your individual response to the medication. As the nerves start to wake up, you may experience tingling as the movement and feeling returns to your extremity. If the numbness and inability to move your extremity has not gone away after 48 hours, please call your surgeon.   2. The extremity that is blocked will need to be protected until the numbness is gone and the strength has returned. Because you cannot feel it, you will need to take extra care to avoid injury. Because it may be weak, you may have difficulty moving it or using it. You may not know what position it is in without looking at it while the block is in effect.  3. For blocks in the legs and feet, returning to weight bearing and walking needs to be done carefully. You will need to wait until the numbness is entirely gone and the strength has returned. You should be able to move your leg and foot normally before you try and bear weight or walk. You will need someone to be with you when you first try to ensure you do not fall and possibly risk injury.  4. Bruising and tenderness at the needle site are common side effects and will resolve in a few days.  5. Persistent numbness or new problems with movement should be communicated to the surgeon or the [facility] [phone] [facility] [phone]).      Post Anesthesia Home Care Instructions  Activity: Get plenty of rest for the remainder of the day. A responsible individual must stay with you for 24 hours following the procedure.  For the next 24 hours, DO NOT: -Drive a car -Advertising copywriter -Drink alcoholic  beverages -Take any medication unless instructed by your physician -Make any legal decisions or sign important papers.  Meals: Start with liquid foods such as gelatin or soup. Progress to regular foods as tolerated. Avoid greasy, spicy, heavy foods. If nausea and/or vomiting occur, drink only clear liquids until the nausea and/or vomiting subsides. Call your physician if vomiting continues.  Special Instructions/Symptoms: Your throat may feel dry or sore from the anesthesia or the breathing tube placed in your throat during surgery. If this causes discomfort, gargle with warm salt water. The discomfort should disappear within 24 hours.  If you had a scopolamine patch placed behind your ear for the management of post- operative nausea and/or vomiting:  1. The medication in the patch is effective for 72 hours, after which it should be removed.  Wrap patch in a tissue and discard in the trash. Wash hands thoroughly with soap and water. 2. You may remove the patch earlier than 72 hours if you experience unpleasant side effects which may include dry mouth, dizziness or visual disturbances. 3. Avoid touching the patch. Wash your hands with soap and water after  contact with the patch.      Next dose of tyelnol may be taken at 5p

## 2024-07-29 NOTE — H&P (Signed)
 Signed     Expand All Collapse All       Chief Complaint: Right knee instability        History of Present Illness:    06/06/2024: Presents today for further discussion of the MRI of her right knee   Gloria Ramos is a 48 y.o. female presents today with ongoing right knee patellar instability.  She most recently experiences dislocation of the patella 2 weeks prior where the kneecap came out and she had to manually reduce this.  She does have a history of this occurring earlier this year as well which was her first initial dislocation event.  This time she is very trephinated has had experiences with this on the contralateral side as well.       PMH/PSH/Family History/Social History/Meds/Allergies:         Past Medical History:  Diagnosis Date   Anemia     Arthritis     Cervical cancer (HCC)     Colitis     Collapsed lung     Crohn's disease (HCC)     Edema     Multiple sclerosis     Pelvic pain in female 09/30/2014   PTSD (post-traumatic stress disorder)     Rheumatoid arthritis (HCC)               Past Surgical History:  Procedure Laterality Date   ABDOMINAL SURGERY       CERVICAL CONE BIOPSY   2015   TUBAL LIGATION            Social History         Socioeconomic History   Marital status: Significant Other      Spouse name: Not on file   Number of children: Not on file   Years of education: Not on file   Highest education level: Not on file  Occupational History   Not on file  Tobacco Use   Smoking status: Some Days      Current packs/day: 0.25      Types: Cigarettes   Smokeless tobacco: Never  Vaping Use   Vaping status: Some Days   Substances: Nicotine  Substance and Sexual Activity   Alcohol use: Never   Drug use: Yes      Types: Marijuana      Comment: daily   Sexual activity: Yes      Partners: Male      Birth control/protection: Surgical  Other Topics Concern   Not on file  Social History Narrative   Not on file    Social  Drivers of Health        Financial Resource Strain: High Risk (05/13/2024)    Overall Financial Resource Strain (CARDIA)     Difficulty of Paying Living Expenses: Very hard  Food Insecurity: Food Insecurity Present (05/13/2024)    Hunger Vital Sign     Worried About Running Out of Food in the Last Year: Often true     Ran Out of Food in the Last Year: Often true  Transportation Needs: Unmet Transportation Needs (05/13/2024)    PRAPARE - Transportation     Lack of Transportation (Medical): Yes     Lack of Transportation (Non-Medical): Yes  Physical Activity: Unknown (05/13/2024)    Exercise Vital Sign     Days of Exercise per Week: Patient declined     Minutes of Exercise per Session: Not on file  Stress: Patient Declined (05/13/2024)    [employer] of Occupational Health -  Occupational Stress Questionnaire     Feeling of Stress: Patient declined  Social Connections: Unknown (05/13/2024)    Social Connection and Isolation Panel     Frequency of Communication with Friends and Family: Patient declined     Frequency of Social Gatherings with Friends and Family: Patient declined     Attends Religious Services: Patient declined     Database Administrator or Organizations: Patient declined     Attends Engineer, Structural: Not on file     Marital Status: Patient declined         Family History  Problem Relation Age of Onset   Breast cancer Maternal Aunt     Cervical cancer Maternal Aunt     Breast cancer Maternal Aunt     BRCA 1/2 Neg Hx          Allergies       Allergies  Allergen Reactions   Codeine Hives and Nausea And Vomiting   Food        Lobster-swelling like a blowfish   Hydrocodone  Hives   Latex        Instant rash, itching and bladder infection   Oxycodone  Hives and Nausea And Vomiting            Current Outpatient Medications  Medication Sig Dispense Refill   acetaminophen  (TYLENOL ) 500 MG tablet Take 1 tablet (500 mg total) by mouth every 8  (eight) hours for 10 days. 30 tablet 0   aspirin  EC 325 MG tablet Take 1 tablet (325 mg total) by mouth daily. 14 tablet 0   HYDROmorphone  (DILAUDID ) 2 MG tablet Take 1 tablet (2 mg total) by mouth every 4 (four) hours as needed for severe pain (pain score 7-10). 20 tablet 0   hydrOXYzine  (ATARAX ) 25 MG tablet Take 1-2 tablets (25-50 mg total) by mouth 2 (two) times daily as needed for anxiety (or sleep). 60 tablet 2   mirtazapine  (REMERON ) 7.5 MG tablet Take 1 tablet (7.5 mg total) by mouth at bedtime. 30 tablet 1   Multiple Vitamins-Minerals (MULTIVITAMIN WITH MINERALS) tablet Take 1 tablet by mouth daily.       naproxen  (NAPROSYN ) 500 MG tablet TAKE 1 TABLET BY MOUTH THREE TIMES DAILY WITH MEALS 30 tablet 0      No current facility-administered medications for this visit.      Imaging Results (Last 48 hours)  No results found.     Review of Systems:   A ROS was performed including pertinent positives and negatives as documented in the HPI.   Physical Exam :   Constitutional: NAD and appears stated age Neurological: Alert and oriented Psych: Appropriate affect and cooperative There were no vitals taken for this visit.    Comprehensive Musculoskeletal Exam:     Right knee with 4 quadrants of lateral patellar motion.  Range of motion is from 0 degrees to 135 degrees.  There is no effusion.  There is tenderness about the MPFL distal neurosensory exam is intact     Imaging:   Xray (4 views right knee): Normal   MRI right knee: Disruption of the medial patellofemoral ligament   I personally reviewed and interpreted the radiographs.     Assessment and Plan:   48 y.o. female evidence of right knee patellar instability and now sustaining a second patella dislocation.  Had multiple instances of instability at this time and as result is hoping to proceed with surgical option for stabilization.  I did discuss that to  me her MRI does not show evidence of chondral involvement and as a  result I do believe she would benefit from an MPFL reconstruction.  I did discuss risk limitation as well as associated recovery timeframe.  After discussion she would like to proceed   -Plan for right knee medial patellofemoral ligament reconstruction     After a lengthy discussion of treatment options, including risks, benefits, alternatives, complications of surgical and nonsurgical conservative options, the patient elected surgical repair.    The patient  is aware of the material risks  and complications including, but not limited to injury to adjacent structures, neurovascular injury, infection, numbness, bleeding, implant failure, thermal burns, stiffness, persistent pain, failure to heal, disease transmission from allograft, need for further surgery, dislocation, anesthetic risks, blood clots, risks of death,and others. The probabilities of surgical success and failure discussed with patient given their particular co-morbidities.The time and nature of expected rehabilitation and recovery was discussed.The patient's questions were all answered preoperatively.  No barriers to understanding were noted. I explained the natural history of the disease process and Rx rationale.  I explained to the patient what I considered to be reasonable expectations given their personal situation.  The final treatment plan was arrived at through a shared patient decision making process model.       I personally saw and evaluated the patient, and participated in the management and treatment plan.   Elspeth Parker, MD Attending Physician, Orthopedic Surgery   This document was dictated using Dragon voice recognition software. A reasonable attempt at proof reading has been made to minimize errors.        Electronically signed by Parker Elspeth, MD at 06/06/2024  3:43 PM

## 2024-07-29 NOTE — Op Note (Signed)
 Date of Surgery: 07/29/2024  INDICATIONS: Ms. Gloria Ramos is a 48 y.o.-year-old female with right patella instability.  The risk and benefits of the procedure were discussed in detail and documented in the pre-operative evaluation.   PREOPERATIVE DIAGNOSIS: 1. Right knee patellar instability  POSTOPERATIVE DIAGNOSIS: Same.  PROCEDURE: 1. Right knee medial patellofemoral ligament reconstruction  SURGEON: Elspeth LITTIE Parker MD  ASSISTANT: Conley Dawson, ATC  ANESTHESIA:  general  IV FLUIDS AND URINE: See anesthesia record.  ANTIBIOTICS: Ancef   ESTIMATED BLOOD LOSS: 10 mL.  IMPLANTS:  Implant Name Type Inv. Item Serial No. Manufacturer Lot No. LRB No. Used Action  GRAFT TISS 230-320 GRACILIS - [id number] Bone Implant GRAFT TISS 230-320 GRACILIS [id number] [employer]  Right 1 Implanted  ANCHOR SUT QUATTRO KNTLS 4.5 - [id number] Anchor ANCHOR SUT QUATTRO KNTLS 4.5  ZIM[employer]ON(ORTH,TRAU,BIO,SG) [id number] Right 1 Implanted  ANCH JUGGERLOOP OC 2.9 MB SUT - [id number] Anchor ANCH JUGGERLOOP OC 2.9 MB SUT  ZIMMER RECON(ORTH,TRAU,BIO,SG) [id number] Right 2 Implanted    DRAINS: None  CULTURES: None  COMPLICATIONS: none  DESCRIPTION OF PROCEDURE:  I identified the patient in the pre-operative holding area.  I marked the operative right knee with my initials. I reviewed the risks and benefits of the proposed surgical intervention and the patient (and/or patient's guardian) wished to proceed.  Anesthesia was then performed with regional block.  The patient was transferred to the operative suite and placed in the supine position with all bony prominences padded.     SCDs were placed on the non-operative lower extremity. Appropriate antibiotics was administered within 1 hour before incision. The operative extremity was then prepped and draped in standard fashion. A time out was performed confirming the correct extremity, correct patient and correct procedure.     At this point, I directed  my attention to medial patellofemoral ligament reconstruction.  On the back table, a 8mm semitendinosus allograft was thawed.    A 3 cm incision over the medial aspect of the patella was created and dissection was carried down to layer 1. Full thickness medial and lateral cutaneous flaps were developed.     At this point, a 15 blade was used to dissect layers 1 and 2 off the medial patella, and I then bluntly developed the interval between layers 2 and 3.  This was taken bluntly all the way to the level of the medial epicondyle.   Next, electrocautery was used to remove soft tissue from the medial aspect of the patella from the proximal pole of the equator. A small osteophyte was encountered there, likely from a prior avulsion fracture.  This was removed with a rongeur.  The medial edge of the patella was debrided with a rongeur to create a bleeding trough of bone to accommodate the graft.  Fluoroscopy was then used to confirm the position of 2 anticipated anchors on the medial patella.  One was placed at the equator, the other halfway between the equator and the superior pole in the native footprint of the MPFL.  These were placed in standard fashion and were both jugger knotless anchors   At this point, the medial epicondylar incision was localized with fluoroscopy and created with a 15 blade.  Sharp dissection was carried down to the fascia.  The fascia was then sharply incised and a Kelly clamp was placed between layers 2 and 3, from the level of the patella, and brought out the medial epicondylar incision, guaranteeing a good soft tissue tunnel for passage of the graft.  At this point, both the graft and a strand of 1.5 mm active braid tape was placed the anchors/loops and these were tightened.  These were passed at the medial epicondylar incision with a passing suture.  At this time the 2 limbs of the graft were sutured together and these were all placed including the active braid into a 4.5  Quatro anchor.  Shuttles point was localized and this was placed into the bone.  These were sequentially tensioned and titrated to 2 quadrants of lateral patellar motion   The wounds were copiously irrigated. All the incisions were then closed in layers, 0-vicryl for deep layers, 2-0 vicryl for deep dermis, and a running subcuticular 3-0 monocryl. The arthroscopy portals were closed with 3- 0 monocryl. A sterile dressing was applied followed by a Polar Care device and a hinged knee brace locked in extension.   The patient awoke from anesthesia without difficulty and was transferred to PACU in stable condition.       POSTOPERATIVE PLAN: She will be weightbearing as tolerated.  She will follow the MPFL protocol.  I will see her back in 2 weeks for suture removal  Elspeth LITTIE Parker, MD 2:13 PM

## 2024-07-29 NOTE — Anesthesia Preprocedure Evaluation (Signed)
 Anesthesia Evaluation  Patient identified by MRN, date of birth, ID band Patient awake    Reviewed: Allergy & Precautions, NPO status , Patient's Chart, lab work & pertinent test results  Airway Mallampati: II  TM Distance: >3 FB Neck ROM: Full    Dental  (+) Dental Advisory Given   Pulmonary shortness of breath and with exertion, Current Smoker and Patient abstained from smoking.   breath sounds clear to auscultation       Cardiovascular negative cardio ROS  Rhythm:Regular Rate:Normal     Neuro/Psych  Headaches  Anxiety      Neuromuscular disease (MS- vision problems and some balance per pt)    GI/Hepatic Neg liver ROS,,,Crohn's dz   Endo/Other  negative endocrine ROS    Renal/GU negative Renal ROS     Musculoskeletal  (+) Arthritis , Rheumatoid disorders,    Abdominal   Peds  Hematology  (+) Blood dyscrasia, anemia   Anesthesia Other Findings   Reproductive/Obstetrics                              Anesthesia Physical Anesthesia Plan  ASA: 2  Anesthesia Plan: General   Post-op Pain Management: Tylenol  PO (pre-op)*, Toradol  IV (intra-op)*, Regional block* and Precedex   Induction: Intravenous  PONV Risk Score and Plan: 2 and Dexamethasone , Ondansetron , Midazolam  and Treatment may vary due to age or medical condition  Airway Management Planned: Oral ETT  Additional Equipment:   Intra-op Plan:   Post-operative Plan: Extubation in OR  Informed Consent: I have reviewed the patients History and Physical, chart, labs and discussed the procedure including the risks, benefits and alternatives for the proposed anesthesia with the patient or authorized representative who has indicated his/her understanding and acceptance.     Dental advisory given  Plan Discussed with: CRNA  Anesthesia Plan Comments:         Anesthesia Quick Evaluation

## 2024-07-29 NOTE — Telephone Encounter (Signed)
 Pharmacy Patient Advocate Encounter   Received notification from CoverMyMeds that prior authorization for AIMOVIG  140MG  is required/requested.   Insurance verification completed.   The patient is insured through [facility].   Per test claim: PA required; PA submitted to above mentioned insurance via Latent Key/confirmation #/EOC [id number] Status is pending

## 2024-07-29 NOTE — Anesthesia Procedure Notes (Signed)
 Procedure Name: Intubation Date/Time: 07/29/2024 1:29 PM  Performed by: Delayne Olam BIRCH, CRNAPre-anesthesia Checklist: Patient identified, Emergency Drugs available, Suction available and Patient being monitored Patient Re-evaluated:Patient Re-evaluated prior to induction Oxygen Delivery Method: Circle system utilized Preoxygenation: Pre-oxygenation with 100% oxygen Induction Type: IV induction, Rapid sequence and Cricoid Pressure applied Ventilation: Mask ventilation without difficulty Laryngoscope Size: Mac and 4 Grade View: Grade I Tube type: Oral Tube size: 7.0 mm Number of attempts: 1 Airway Equipment and Method: Stylet and Oral airway Placement Confirmation: ETT inserted through vocal cords under direct vision, positive ETCO2 and breath sounds checked- equal and bilateral Secured at: 22 cm Tube secured with: Tape Dental Injury: Teeth and Oropharynx as per pre-operative assessment

## 2024-07-29 NOTE — Interval H&P Note (Signed)
 History and Physical Interval Note:  07/29/2024 11:15 AM  Gloria Ramos  has presented today for surgery, with the diagnosis of RIGHT KNEE PATELLAR INSTABILITY.  The various methods of treatment have been discussed with the patient and family. After consideration of risks, benefits and other options for treatment, the patient has consented to  Procedure(s) with comments: RECONSTRUCTION, LIGAMENT, MEDIAL PATELLOFEMORAL (Right) - RIGHT KNEE ARTHROSCOPY WITH MEDIAL PATELLOFEMORAL RECONSTRUCTION as a surgical intervention.  The patient's history has been reviewed, patient examined, no change in status, stable for surgery.  I have reviewed the patient's chart and labs.  Questions were answered to the patient's satisfaction.     [name]

## 2024-07-29 NOTE — Transfer of Care (Signed)
 Immediate Anesthesia Transfer of Care Note  Patient: Gloria Ramos  Procedure(s) Performed: Procedure(s) (LRB): Right knee medial patellofemoral ligament reconstruction (Right)  Patient Location: PACU  Anesthesia Type: General  Level of Consciousness: awake, oriented, sedated and patient cooperative  Airway & Oxygen Therapy: Patient Spontanous Breathing and Patient connected to face mask oxygen  Post-op Assessment: Report given to PACU RN and Post -op Vital signs reviewed and stable  Post vital signs: Reviewed and stable  Complications: No apparent anesthesia complications  Last Vitals:  Vitals Value Taken Time  BP 108/60 07/29/24 14:18  Temp    Pulse 65 07/29/24 14:21  Resp 27 07/29/24 14:21  SpO2 96 % 07/29/24 14:21  Vitals shown include unfiled device data.  Last Pain:  Vitals:   07/29/24 0947  TempSrc: Temporal  PainSc: 0-No pain      Patients Stated Pain Goal: 5 (07/29/24 0947)  Complications: No notable events documented.

## 2024-07-29 NOTE — Brief Op Note (Signed)
   Brief Op Note  Date of Surgery: 07/29/2024  Preoperative Diagnosis: RIGHT KNEE PATELLAR INSTABILITY  Postoperative Diagnosis: same  Procedure: Procedure(s): Right knee medial patellofemoral ligament reconstruction  Implants: Implant Name Type Inv. Item Serial No. Manufacturer Lot No. LRB No. Used Action  GRAFT TISS 230-320 GRACILIS - [id number] Bone Implant GRAFT TISS 230-320 GRACILIS [id number] [employer]  Right 1 Implanted  ANCHOR SUT QUATTRO KNTLS 4.5 - [id number] Anchor ANCHOR SUT QUATTRO KNTLS 4.5  ZIMMER RECON(ORTH,TRAU,BIO,SG) [id number] Right 1 Implanted  ANCH JUGGERLOOP OC 2.9 MB SUT - [id number] Anchor ANCH JUGGERLOOP OC 2.9 MB SUT  ZIMMER RECON(ORTH,TRAU,BIO,SG) [id number] Right 2 Implanted    Surgeons: Surgeon(s): Gloria Standing, MD  Anesthesia: General    Estimated Blood Loss: See anesthesia record  Complications: None  Condition to PACU: Stable  Ramos LITTIE Genelle, MD 07/29/2024 2:13 PM

## 2024-07-29 NOTE — Anesthesia Procedure Notes (Signed)
 Anesthesia Regional Block: Adductor canal block   Pre-Anesthetic Checklist: , timeout performed,  Correct Patient, Correct Site, Correct Laterality,  Correct Procedure, Correct Position, site marked,  Risks and benefits discussed,  Surgical consent,  Pre-op evaluation,  At surgeon's request and post-op pain management  Laterality: Right  Prep: chloraprep       Needles:  Injection technique: Single-shot  Needle Type: Echogenic Needle     Needle Length: 9cm  Needle Gauge: 21     Additional Needles:   Procedures:,,,, ultrasound used (permanent image in chart),,    Narrative:  Start time: 07/29/2024 12:08 PM End time: 07/29/2024 12:15 PM Injection made incrementally with aspirations every 5 mL.  Performed by: Personally  Anesthesiologist: Epifanio Charleston, MD

## 2024-07-29 NOTE — Progress Notes (Signed)
Assisted Dr. Rob Fitzgerald with right, adductor canal, ultrasound guided block. Side rails up, monitors on throughout procedure. See vital signs in flow sheet. Tolerated Procedure well. 

## 2024-07-30 ENCOUNTER — Encounter (HOSPITAL_BASED_OUTPATIENT_CLINIC_OR_DEPARTMENT_OTHER): Payer: Self-pay | Admitting: Orthopaedic Surgery

## 2024-07-30 NOTE — Anesthesia Postprocedure Evaluation (Signed)
 Anesthesia Post Note  Patient: Gloria Ramos  Procedure(s) Performed: Right knee medial patellofemoral ligament reconstruction (Right: Knee)     Patient location during evaluation: PACU Anesthesia Type: General Level of consciousness: awake and alert Pain management: pain level controlled Vital Signs Assessment: post-procedure vital signs reviewed and stable Respiratory status: spontaneous breathing, nonlabored ventilation, respiratory function stable and patient connected to nasal cannula oxygen Cardiovascular status: blood pressure returned to baseline and stable Postop Assessment: no apparent nausea or vomiting Anesthetic complications: no   No notable events documented.  Last Vitals:  Vitals:   07/29/24 1500 07/29/24 1516  BP: 110/67 120/73  Pulse: (!) 55 (!) 50  Resp: (!) 21 20  Temp:  (!) 36.2 C  SpO2: 93% 97%    Last Pain:  Vitals:   07/29/24 1516  TempSrc:   PainSc: 0-No pain                 Epifanio Lamar BRAVO

## 2024-08-01 ENCOUNTER — Encounter (HOSPITAL_BASED_OUTPATIENT_CLINIC_OR_DEPARTMENT_OTHER): Payer: Self-pay | Admitting: Physical Therapy

## 2024-08-01 ENCOUNTER — Ambulatory Visit (HOSPITAL_BASED_OUTPATIENT_CLINIC_OR_DEPARTMENT_OTHER): Admitting: Physical Therapy

## 2024-08-01 ENCOUNTER — Other Ambulatory Visit: Payer: Self-pay

## 2024-08-01 ENCOUNTER — Ambulatory Visit: Admitting: Family

## 2024-08-01 ENCOUNTER — Other Ambulatory Visit (HOSPITAL_BASED_OUTPATIENT_CLINIC_OR_DEPARTMENT_OTHER): Payer: Self-pay | Admitting: Orthopaedic Surgery

## 2024-08-01 DIAGNOSIS — M6281 Muscle weakness (generalized): Secondary | ICD-10-CM

## 2024-08-01 DIAGNOSIS — R262 Difficulty in walking, not elsewhere classified: Secondary | ICD-10-CM | POA: Insufficient documentation

## 2024-08-01 DIAGNOSIS — M25561 Pain in right knee: Secondary | ICD-10-CM | POA: Insufficient documentation

## 2024-08-01 DIAGNOSIS — M25361 Other instability, right knee: Secondary | ICD-10-CM

## 2024-08-01 NOTE — Therapy (Signed)
 OUTPATIENT PHYSICAL THERAPY EVALUATION   Patient Name: Gloria Ramos MRN: [id number] DOB:1975-10-06, 48 y.o., female Today's Date: 08/01/2024  END OF SESSION:  PT End of Session - 08/01/24 1148     Visit Number 1    Number of Visits 21    Date for Recertification  10/26/23    Authorization Type [place] [employer]    PT Start Time 1100    PT Stop Time 1128    PT Time Calculation (min) 28 min    Activity Tolerance Patient tolerated treatment well    Behavior During Therapy WFL for tasks assessed/performed          Past Medical History:  Diagnosis Date   Anemia    Arthritis    Cervical cancer (HCC)    Colitis    Collapsed lung    Crohn's disease (HCC)    Edema    Multiple sclerosis    Pelvic pain in female 09/30/2014   PTSD (post-traumatic stress disorder)    Rheumatoid arthritis (HCC)    Past Surgical History:  Procedure Laterality Date   ABDOMINAL SURGERY     CERVICAL CONE BIOPSY  2015   MEDIAL PATELLOFEMORAL LIGAMENT REPAIR Right 07/29/2024   Procedure: Right knee medial patellofemoral ligament reconstruction;  Surgeon: Genelle Standing, MD;  Location: [facility];  Service: Orthopedics;  Laterality: Right;   TUBAL LIGATION     Patient Active Problem List   Diagnosis Date Noted   Patellar instability of right knee 07/29/2024   Perimenopausal vasomotor symptoms 02/13/2024   Panic attacks 02/13/2024   Use of cannabis 02/13/2024   Neuropathy 02/13/2024   PTSD (post-traumatic stress disorder) 02/13/2024   Irregular menses 01/29/2024   Crohn's disease (HCC) 01/29/2024   Multiple sclerosis 01/17/2024   Spontaneous pneumothorax 09/30/2014   Cervical dysplasia 09/30/2014   Decreased hearing 09/30/2014     REFERRING PROVIDER:  Genelle Standing, MD      REFERRING DIAG:  M25.361 (ICD-10-CM) - Patellar instability of right knee      S/p PROCEDURE: 1. Right knee medial patellofemoral ligament reconstruction  Rationale for Evaluation and Treatment:  Rehabilitation  THERAPY DIAG:  Acute pain of right knee  Difficulty in walking, not elsewhere classified  Muscle weakness (generalized)  ONSET DATE: DOS 12/9   SUBJECTIVE:                                                                                                                                                                                           SUBJECTIVE STATEMENT: It is not tolerable without pain meds and I am allergic to them. It is very uncomfortable. Staying with  daughter who has 4 steps into home and low toilets.   PERTINENT HISTORY:  MS, PTSD  PAIN:  Are you having pain? Yes: NPRS scale: severe Pain location: Rt knee Pain description: severe Aggravating factors: moving, constant Relieving factors: rest, ice, meds  PRECAUTIONS:  None  RED FLAGS: None   WEIGHT BEARING RESTRICTIONS:  WBAT  FALLS:  Has patient fallen in last 6 months? No  LIVING ENVIRONMENT: Has stairs in home, 4 steps to home with daughter  OCCUPATION:  Not working  PLOF:  Independent  PATIENT GOALS:  Back to running   OBJECTIVE:  Note: Objective measures were completed at Evaluation unless otherwise noted.   PATIENT SURVEYS:  LEFS  Extreme difficulty/unable (0), Quite a bit of difficulty (1), Moderate difficulty (2), Little difficulty (3), No difficulty (4) Survey date:  EVAL 12/12  Any of your usual work, housework or school activities   2. Usual hobbies, recreational or sporting activities   3. Getting into/out of the bath   4. Walking between rooms 1  5. Putting on socks/shoes   6. Squatting    7. Lifting an object, like a bag of groceries from the floor   8. Performing light activities around your home   9. Performing heavy activities around your home   10. Getting into/out of a car 1  11. Walking 2 blocks   12. Walking 1 mile   13. Going up/down 10 stairs (1 flight)   14. Standing for 1 hour   15.  sitting for 1 hour 2  16. Running on even ground   17.  Running on uneven ground   18. Making sharp turns while running fast   19. Hopping    20. Rolling over in bed 2  Score total:  6     COGNITIVE STATUS: Within functional limits for tasks assessed   SENSATION: Numbness reported in leg/foot  EDEMA:  Yes: notable edema through leg and foot which pt says is usual for her   GAIT: Comments: EVAL  arrived with single axillary crutch- educated on using opposite of surgical side.    Body Part #1 Knee  PALPATION: EVAL: non-pitting edema, no s/s of infection  LOWER EXTREMITY ROM:   Left AROM throughout is [facility]  Active  Right eval   Knee flexion 30   Knee extension -5   Ankle dorsiflexion 0   Ankle plantarflexion    Ankle inversion    Ankle eversion     (Blank rows = not tested)   TREATMENT DATE:   EVAL Changed bandages Ankle pumps in elevation Quad sets Seated heel slide- instructed on lock/unlock mechanism to use restroom.  Gait with single crutch   PATIENT EDUCATION:  Education details: Anatomy of condition, POC, HEP, exercise form/rationale Person educated: Patient Education method: Explanation, Demonstration, Tactile cues, Verbal cues, and Handouts Education comprehension: verbalized understanding, returned demonstration, verbal cues required, tactile cues required, and needs further education   HOME EXERCISE PROGRAM: Access Code: [email or web address] URL: [email or web address] Date: 08/01/2024 Prepared by: Harlene Cordon  Exercises - Ankle Pumps in Elevation  - 3-4 x daily - 7 x weekly - 3 sets - 10 reps - Supine Quad Set  - 3-4 x daily - 7 x weekly - 1 sets - 10 reps - 5s hold - Active Straight Leg Raise with Quad Set  - 3-4 x daily - 7 x weekly - 1 sets - 10 reps - Seated Heel Slide  - 3-4 x daily - 7 x weekly - 1  sets - 10 reps   ASSESSMENT:  CLINICAL IMPRESSION: Patient is a 48 y.o. F who was seen today for physical therapy evaluation and treatment for s/p Rt MPFLR.     REHAB POTENTIAL:  Good  CLINICAL DECISION MAKING: Stable/uncomplicated  EVALUATION COMPLEXITY: Low   GOALS: Goals reviewed with patient? Yes   SHORT TERM GOALS: 08/29/2024  Ambulation with good quad set, without AD Baseline: Goal status: INITIAL   2.  ROM 0-90 without increased pain Baseline:  Goal status: INITIAL   3.  Independent with HEP as it has been established in the short term Baseline:  Goal status: INITIAL   4.  Compliance with RICE techniques to control edema Baseline:  Goal status: INITIAL    LONG TERM GOALS:  SLS on involved LE with good control for 20s Baseline: unable at eval Goal status: INITIAL   Target date: 09/26/2024  8 weeks  2.  No longer using AD for household and shorter community distances Baseline: requires AD at eval Goal status: INITIAL  Target date: 09/26/2024  8 weeks  3.  Demonstrate reciprocal stair navigation without compensation Baseline: unable at eval Goal status: INITIAL  Target date: 10/24/2024  12 weeks  4.  Lunges and squats with good control of varus valgus motion Baseline: unable at eval Goal status: INITIAL  Target date: 10/24/2024  12 weeks  5.  LE MMT via handheld dynamometry or Tindeq to 85% of uninvolved LE Baseline: not appropriate to test at eval Goal status: INITIAL  Target date: 11/21/24 16 weeks  6. Demo proper form with running Baseline: unable at eval Goal status: INITIAL  Target date: 11/21/24 6 months      PLAN:  PT FREQUENCY: 1-2x/week  PT DURATION: POC date  PLANNED INTERVENTIONS: 97164- PT Re-evaluation, 97750- Physical Performance Testing, 97110-Therapeutic exercises, 97530- Therapeutic activity, 97112- Neuromuscular re-education, 97535- Self Care, 02859- Manual therapy, [phone]- Gait training, [phone]- Aquatic Therapy, [phone] (1-2 muscles), 20561 (3+ muscles)- Dry Needling, Patient/Family education, Balance training, Stair training, Taping, Joint mobilization, Spinal mobilization, Scar mobilization, and  Cryotherapy.  PLAN FOR NEXT SESSION: per MPFL protocol, requested that she bring shoes that hold her heel   Harlene Cordon, PT, DPT 08/01/2024, 11:57 AM  For all possible CPT codes, reference the Planned Interventions line above.     Check all conditions that are expected to impact treatment: {Conditions expected to impact treatment:Neurological condition and/or seizures   If treatment provided at initial evaluation, no treatment charged due to lack of authorization.

## 2024-08-04 ENCOUNTER — Encounter (HOSPITAL_BASED_OUTPATIENT_CLINIC_OR_DEPARTMENT_OTHER): Payer: Self-pay | Admitting: Physical Therapy

## 2024-08-04 NOTE — Telephone Encounter (Signed)
 Pharmacy Patient Advocate Encounter  Received notification from [employer] that Prior Authorization for AIMOVIG  140MG  has been DENIED.  Full denial letter will be uploaded to the media tab. See denial reason below.    PA #/Case ID/Reference #: [id number]

## 2024-08-05 ENCOUNTER — Encounter (HOSPITAL_BASED_OUTPATIENT_CLINIC_OR_DEPARTMENT_OTHER): Payer: Self-pay | Admitting: Physical Therapy

## 2024-08-05 ENCOUNTER — Other Ambulatory Visit: Payer: Self-pay | Admitting: Orthopaedic Surgery

## 2024-08-05 MED ORDER — HYDROMORPHONE HCL 2 MG PO TABS: 2.0000 mg | ORAL_TABLET | ORAL | 0 refills | Status: AC | PRN

## 2024-08-06 ENCOUNTER — Encounter: Payer: Self-pay | Admitting: Family

## 2024-08-08 ENCOUNTER — Encounter (HOSPITAL_BASED_OUTPATIENT_CLINIC_OR_DEPARTMENT_OTHER): Payer: Self-pay

## 2024-08-08 ENCOUNTER — Ambulatory Visit (INDEPENDENT_AMBULATORY_CARE_PROVIDER_SITE_OTHER): Admitting: Orthopaedic Surgery

## 2024-08-08 ENCOUNTER — Ambulatory Visit (HOSPITAL_BASED_OUTPATIENT_CLINIC_OR_DEPARTMENT_OTHER): Admitting: Physical Therapy

## 2024-08-08 DIAGNOSIS — M25361 Other instability, right knee: Secondary | ICD-10-CM

## 2024-08-08 NOTE — Progress Notes (Signed)
 "                                Post Operative Evaluation    Procedure/Date of Surgery: Right knee MPFL reconstruction 12/9  Interval History:   Since 2 weeks status post above procedure.  At this time she has been having a hard time mobilizing.  Denies any redness or swelling in the knee   PMH/PSH/Family History/Social History/Meds/Allergies:    Past Medical History:  Diagnosis Date   Anemia    Arthritis    Cervical cancer (HCC)    Colitis    Collapsed lung    Crohn's disease (HCC)    Edema    Multiple sclerosis    Pelvic pain in female 09/30/2014   PTSD (post-traumatic stress disorder)    Rheumatoid arthritis (HCC)    Past Surgical History:  Procedure Laterality Date   ABDOMINAL SURGERY     CERVICAL CONE BIOPSY  2015   MEDIAL PATELLOFEMORAL LIGAMENT REPAIR Right 07/29/2024   Procedure: Right knee medial patellofemoral ligament reconstruction;  Surgeon: Genelle Standing, MD;  Location: [facility];  Service: Orthopedics;  Laterality: Right;   TUBAL LIGATION     Social History   Socioeconomic History   Marital status: Single    Spouse name: Not on file   Number of children: Not on file   Years of education: Not on file   Highest education level: 10th grade  Occupational History   Not on file  Tobacco Use   Smoking status: Some Days    Current packs/day: 0.25    Types: Cigarettes   Smokeless tobacco: Never  Vaping Use   Vaping status: Some Days   Substances: Nicotine  Substance and Sexual Activity   Alcohol use: Never   Drug use: Yes    Types: Marijuana    Comment: daily   Sexual activity: Yes    Partners: Male    Birth control/protection: Surgical  Other Topics Concern   Not on file  Social History Narrative   Right handed       Social Drivers of Health   Tobacco Use: High Risk (08/01/2024)   Patient History    Smoking Tobacco Use: Some Days    Smokeless Tobacco Use: Never    Passive Exposure: Not on file  Financial Resource  Strain: High Risk (07/31/2024)   Overall Financial Resource Strain (CARDIA)    Difficulty of Paying Living Expenses: Hard  Food Insecurity: Food Insecurity Present (07/31/2024)   Epic    Worried About Programme Researcher, Broadcasting/film/video in the Last Year: Often true    Ran Out of Food in the Last Year: Often true  Transportation Needs: Unmet Transportation Needs (07/31/2024)   Epic    Lack of Transportation (Medical): Yes    Lack of Transportation (Non-Medical): Yes  Physical Activity: Insufficiently Active (07/31/2024)   Exercise Vital Sign    Days of Exercise per Week: 2 days    Minutes of Exercise per Session: 20 min  Stress: Stress Concern Present (07/31/2024)   [employer] of Occupational Health - Occupational Stress Questionnaire    Feeling of Stress: Rather much  Social Connections: Unknown (07/31/2024)   Social Connection and Isolation Panel    Frequency of Communication with Friends and Family: More than three times a week    Frequency of Social Gatherings with Friends and Family: More than three times a week    Attends Religious Services:  Never    Active Member of Clubs or Organizations: No    Attends Banker Meetings: Not on file    Marital Status: Patient declined  Depression (PHQ2-9): Medium Risk (02/18/2024)   Depression (PHQ2-9)    PHQ-2 Score: 9  Alcohol Screen: Low Risk (07/31/2024)   Alcohol Screen    Last Alcohol Screening Score (AUDIT): 0  Housing: High Risk (07/31/2024)   Epic    Unable to Pay for Housing in the Last Year: Patient declined    Number of Times Moved in the Last Year: Not on file    Homeless in the Last Year: Yes  Utilities: Not on file  Health Literacy: Not on file   Family History  Problem Relation Age of Onset   Migraines Mother    Migraines Maternal Aunt    Breast cancer Maternal Aunt    Cervical cancer Maternal Aunt    Breast cancer Maternal Aunt    Migraines Maternal Grandmother    BRCA 1/2 Neg Hx    Allergies[1] Current  Outpatient Medications  Medication Sig Dispense Refill   aspirin  EC 325 MG tablet Take 1 tablet (325 mg total) by mouth daily. (Patient not taking: Reported on 07/22/2024) 14 tablet 0   Erenumab -aooe (AIMOVIG ) 140 MG/ML SOAJ Inject 140 mg into the skin every 28 (twenty-eight) days. (Patient not taking: Reported on 07/24/2024) 1.12 mL 5   HYDROmorphone  (DILAUDID ) 2 MG tablet Take 1 tablet (2 mg total) by mouth every 4 (four) hours as needed for severe pain (pain score 7-10). 20 tablet 0   hydrOXYzine  (ATARAX ) 25 MG tablet Take 1-2 tablets (25-50 mg total) by mouth 2 (two) times daily as needed for anxiety (or sleep). 60 tablet 2   mirtazapine  (REMERON ) 15 MG tablet Take 1 tablet (15 mg total) by mouth at bedtime. (Patient not taking: Reported on 07/22/2024) 30 tablet 2   Multiple Vitamins-Minerals (MULTIVITAMIN WITH MINERALS) tablet Take 1 tablet by mouth daily.     naproxen  (NAPROSYN ) 500 MG tablet TAKE 1 TABLET BY MOUTH THREE TIMES DAILY WITH MEALS 30 tablet 0   ondansetron  (ZOFRAN -ODT) 4 MG disintegrating tablet Take 1 tablet (4 mg total) by mouth every 8 (eight) hours as needed. 20 tablet 5   SUMAtriptan  (IMITREX ) 50 MG tablet Take 1 tablet (50 mg total) by mouth as needed for migraine. May repeat in 2 hours if headache persists or recurs.  Maximum 2 tablets in 24 hours. 10 tablet 5   No current facility-administered medications for this visit.   No results found.  Review of Systems:   A ROS was performed including pertinent positives and negatives as documented in the HPI.   Musculoskeletal Exam:    There were no vitals taken for this visit.  Right knee with well-appearing incisions range of motion deferred today although she does have full extension.  Distal neurosensory exams intact  Imaging:      I personally reviewed and interpreted the radiographs.   Assessment:   2 weeks status post right knee MPFL reconstruction doing well.  At this time she may continue to progress her  range of motion and I will plan to see her back in 4 weeks for reassessment  Plan :    - Return to clinic 4 weeks for reassessment      I personally saw and evaluated the patient, and participated in the management and treatment plan.  Elspeth Parker, MD Attending Physician, Orthopedic Surgery  This document was dictated using Dragon voice recognition  software. A reasonable attempt at proof reading has been made to minimize errors.    [1]  Allergies Allergen Reactions   Shellfish Allergy Swelling   Codeine Hives and Nausea And Vomiting   Hydrocodone  Hives   Latex     Instant rash, itching and bladder infection   Oxycodone  Hives and Nausea And Vomiting   "

## 2024-08-08 NOTE — Telephone Encounter (Signed)
 Information has been sent to clinical pharmacist for appeals review. It may take 5-7 days to prepare the necessary documentation to request the appeal from the insurance.

## 2024-08-11 ENCOUNTER — Telehealth (HOSPITAL_BASED_OUTPATIENT_CLINIC_OR_DEPARTMENT_OTHER): Payer: Self-pay | Admitting: Orthopaedic Surgery

## 2024-08-11 ENCOUNTER — Telehealth: Payer: Self-pay | Admitting: Pharmacist

## 2024-08-11 NOTE — Telephone Encounter (Signed)
 Appeal has been submitted. Will advise when response is received, please be advised that most companies may take 30 days to make a decision. Appeal letter and supporting clinical documentation have been faxed to [phone] on 08/11/2024 @12 :58 pm.  Thank you, Devere Pandy, PharmD Clinical Pharmacist  [facility]  Direct Dial: [phone]

## 2024-08-11 NOTE — Telephone Encounter (Signed)
I left voicemail for patient requesting return call. 

## 2024-08-11 NOTE — Telephone Encounter (Signed)
 Patient states her ice machine for her leg stopped working please advise. Best contact [phone]

## 2024-08-12 ENCOUNTER — Ambulatory Visit
Admission: RE | Admit: 2024-08-12 | Discharge: 2024-08-12 | Disposition: A | Discharge status: Home / Self Care | Source: Ambulatory Visit | Attending: Neurology | Admitting: Neurology

## 2024-08-12 ENCOUNTER — Encounter (HOSPITAL_BASED_OUTPATIENT_CLINIC_OR_DEPARTMENT_OTHER): Admitting: Physical Therapy

## 2024-08-12 DIAGNOSIS — G35D Multiple sclerosis, unspecified: Secondary | ICD-10-CM

## 2024-08-12 MED ORDER — GADOPICLENOL 0.5 MMOL/ML IV SOLN
10.0000 mL | Freq: Once | INTRAVENOUS | Status: AC | PRN
  Administered 2024-08-12: 10 mL via INTRAVENOUS

## 2024-08-12 NOTE — Therapy (Deleted)
 " OUTPATIENT PHYSICAL THERAPY TREATMENT   Patient Name: Gloria Ramos MRN: [id number] DOB:02-15-1976, 48 y.o., female Today's Date: 08/12/2024  END OF SESSION:    Past Medical History:  Diagnosis Date   Anemia    Arthritis    Cervical cancer (HCC)    Colitis    Collapsed lung    Crohn's disease (HCC)    Edema    Multiple sclerosis    Pelvic pain in female 09/30/2014   PTSD (post-traumatic stress disorder)    Rheumatoid arthritis (HCC)    Past Surgical History:  Procedure Laterality Date   ABDOMINAL SURGERY     CERVICAL CONE BIOPSY  2015   MEDIAL PATELLOFEMORAL LIGAMENT REPAIR Right 07/29/2024   Procedure: Right knee medial patellofemoral ligament reconstruction;  Surgeon: Genelle Standing, MD;  Location: [facility];  Service: Orthopedics;  Laterality: Right;   TUBAL LIGATION     Patient Active Problem List   Diagnosis Date Noted   Patellar instability of right knee 07/29/2024   Perimenopausal vasomotor symptoms 02/13/2024   Panic attacks 02/13/2024   Use of cannabis 02/13/2024   Neuropathy 02/13/2024   PTSD (post-traumatic stress disorder) 02/13/2024   Irregular menses 01/29/2024   Crohn's disease (HCC) 01/29/2024   Multiple sclerosis 01/17/2024   Spontaneous pneumothorax 09/30/2014   Cervical dysplasia 09/30/2014   Decreased hearing 09/30/2014     REFERRING PROVIDER:  Genelle Standing, MD      REFERRING DIAG:  M25.361 (ICD-10-CM) - Patellar instability of right knee      S/p PROCEDURE: 1. Right knee medial patellofemoral ligament reconstruction  Rationale for Evaluation and Treatment: Rehabilitation  THERAPY DIAG:  No diagnosis found.  ONSET DATE: DOS 12/9   SUBJECTIVE:                                                                                                                                                                                           SUBJECTIVE STATEMENT: It is not tolerable without pain meds and I am allergic to  them. It is very uncomfortable. Staying with daughter who has 4 steps into home and low toilets.   PERTINENT HISTORY:  MS, PTSD  PAIN:  Are you having pain? Yes: NPRS scale: severe Pain location: Rt knee Pain description: severe Aggravating factors: moving, constant Relieving factors: rest, ice, meds  PRECAUTIONS:  None  RED FLAGS: None   WEIGHT BEARING RESTRICTIONS:  WBAT  FALLS:  Has patient fallen in last 6 months? No  LIVING ENVIRONMENT: Has stairs in home, 4 steps to home with daughter  OCCUPATION:  Not working  PLOF:  Independent  PATIENT GOALS:  Back to  running   OBJECTIVE:  Note: Objective measures were completed at Evaluation unless otherwise noted.   PATIENT SURVEYS:  LEFS  Extreme difficulty/unable (0), Quite a bit of difficulty (1), Moderate difficulty (2), Little difficulty (3), No difficulty (4) Survey date:  EVAL 12/12  Any of your usual work, housework or school activities   2. Usual hobbies, recreational or sporting activities   3. Getting into/out of the bath   4. Walking between rooms 1  5. Putting on socks/shoes   6. Squatting    7. Lifting an object, like a bag of groceries from the floor   8. Performing light activities around your home   9. Performing heavy activities around your home   10. Getting into/out of a car 1  11. Walking 2 blocks   12. Walking 1 mile   13. Going up/down 10 stairs (1 flight)   14. Standing for 1 hour   15.  sitting for 1 hour 2  16. Running on even ground   17. Running on uneven ground   18. Making sharp turns while running fast   19. Hopping    20. Rolling over in bed 2  Score total:  6     COGNITIVE STATUS: Within functional limits for tasks assessed   SENSATION: Numbness reported in leg/foot  EDEMA:  Yes: notable edema through leg and foot which pt says is usual for her   GAIT: Comments: EVAL  arrived with single axillary crutch- educated on using opposite of surgical side.    Body  Part #1 Knee  PALPATION: EVAL: non-pitting edema, no s/s of infection  LOWER EXTREMITY ROM:   Left AROM throughout is [facility]  Active  Right eval   Knee flexion 30   Knee extension -5   Ankle dorsiflexion 0   Ankle plantarflexion    Ankle inversion    Ankle eversion     (Blank rows = not tested)   TREATMENT DATE:   EVAL Changed bandages Ankle pumps in elevation Quad sets Seated heel slide- instructed on lock/unlock mechanism to use restroom.  Gait with single crutch   PATIENT EDUCATION:  Education details: Anatomy of condition, POC, HEP, exercise form/rationale Person educated: Patient Education method: Explanation, Demonstration, Tactile cues, Verbal cues, and Handouts Education comprehension: verbalized understanding, returned demonstration, verbal cues required, tactile cues required, and needs further education   HOME EXERCISE PROGRAM: Access Code: [email or web address] URL: [email or web address] Date: 08/01/2024 Prepared by: Harlene Cordon  Exercises - Ankle Pumps in Elevation  - 3-4 x daily - 7 x weekly - 3 sets - 10 reps - Supine Quad Set  - 3-4 x daily - 7 x weekly - 1 sets - 10 reps - 5s hold - Active Straight Leg Raise with Quad Set  - 3-4 x daily - 7 x weekly - 1 sets - 10 reps - Seated Heel Slide  - 3-4 x daily - 7 x weekly - 1 sets - 10 reps   ASSESSMENT:  CLINICAL IMPRESSION: Patient is a 48 y.o. F who was seen today for physical therapy evaluation and treatment for s/p Rt MPFLR.     REHAB POTENTIAL: Good  CLINICAL DECISION MAKING: Stable/uncomplicated  EVALUATION COMPLEXITY: Low   GOALS: Goals reviewed with patient? Yes   SHORT TERM GOALS: 09/09/2024  Ambulation with good quad set, without AD Baseline: Goal status: INITIAL   2.  ROM 0-90 without increased pain Baseline:  Goal status: INITIAL   3.  Independent with HEP as it has been established  in the short term Baseline:  Goal status: INITIAL   4.  Compliance with RICE  techniques to control edema Baseline:  Goal status: INITIAL    LONG TERM GOALS:  SLS on involved LE with good control for 20s Baseline: unable at eval Goal status: INITIAL   Target date: 09/26/2024  8 weeks  2.  No longer using AD for household and shorter community distances Baseline: requires AD at eval Goal status: INITIAL  Target date: 09/26/2024  8 weeks  3.  Demonstrate reciprocal stair navigation without compensation Baseline: unable at eval Goal status: INITIAL  Target date: 10/24/2024  12 weeks  4.  Lunges and squats with good control of varus valgus motion Baseline: unable at eval Goal status: INITIAL  Target date: 10/24/2024  12 weeks  5.  LE MMT via handheld dynamometry or Tindeq to 85% of uninvolved LE Baseline: not appropriate to test at eval Goal status: INITIAL  Target date: 11/21/24 16 weeks  6. Demo proper form with running Baseline: unable at eval Goal status: INITIAL  Target date: 11/21/24 6 months      PLAN:  PT FREQUENCY: 1-2x/week  PT DURATION: POC date  PLANNED INTERVENTIONS: 97164- PT Re-evaluation, 97750- Physical Performance Testing, 97110-Therapeutic exercises, 97530- Therapeutic activity, 97112- Neuromuscular re-education, 97535- Self Care, 02859- Manual therapy, [phone]- Gait training, [phone]- Aquatic Therapy, [phone] (1-2 muscles), 20561 (3+ muscles)- Dry Needling, Patient/Family education, Balance training, Stair training, Taping, Joint mobilization, Spinal mobilization, Scar mobilization, and Cryotherapy.  PLAN FOR NEXT SESSION: per MPFL protocol, requested that she bring shoes that hold her heel   Rojean JONELLE Batten, PT, DPT 08/12/2024, 7:05 AM  For all possible CPT codes, reference the Planned Interventions line above.     Check all conditions that are expected to impact treatment: {Conditions expected to impact treatment:Neurological condition and/or seizures   If treatment provided at initial evaluation, no treatment charged due to lack of  authorization.       "

## 2024-08-13 ENCOUNTER — Other Ambulatory Visit (HOSPITAL_COMMUNITY): Payer: Self-pay

## 2024-08-19 ENCOUNTER — Ambulatory Visit (HOSPITAL_BASED_OUTPATIENT_CLINIC_OR_DEPARTMENT_OTHER): Admitting: Physical Therapy

## 2024-08-19 DIAGNOSIS — M25561 Pain in right knee: Secondary | ICD-10-CM | POA: Diagnosis not present

## 2024-08-19 DIAGNOSIS — R262 Difficulty in walking, not elsewhere classified: Secondary | ICD-10-CM

## 2024-08-19 DIAGNOSIS — M6281 Muscle weakness (generalized): Secondary | ICD-10-CM

## 2024-08-19 NOTE — Therapy (Signed)
 " OUTPATIENT PHYSICAL THERAPY TREATMENT   Patient Name: Gloria Ramos MRN: [id number] DOB:27-Aug-1975, 48 y.o., female Today's Date: 08/19/2024  END OF SESSION:  PT End of Session - 08/19/24 1157     Visit Number 2    Number of Visits 21    Date for Recertification  10/26/23    Authorization Type [place] [employer]    PT Start Time 1155    PT Stop Time 1240    PT Time Calculation (min) 45 min    Activity Tolerance Patient tolerated treatment well;Patient limited by pain    Behavior During Therapy [facility] for tasks assessed/performed           Past Medical History:  Diagnosis Date   Anemia    Arthritis    Cervical cancer (HCC)    Colitis    Collapsed lung    Crohn's disease (HCC)    Edema    Multiple sclerosis    Pelvic pain in female 09/30/2014   PTSD (post-traumatic stress disorder)    Rheumatoid arthritis (HCC)    Past Surgical History:  Procedure Laterality Date   ABDOMINAL SURGERY     CERVICAL CONE BIOPSY  2015   MEDIAL PATELLOFEMORAL LIGAMENT REPAIR Right 07/29/2024   Procedure: Right knee medial patellofemoral ligament reconstruction;  Surgeon: Genelle Standing, MD;  Location: [facility];  Service: Orthopedics;  Laterality: Right;   TUBAL LIGATION     Patient Active Problem List   Diagnosis Date Noted   Patellar instability of right knee 07/29/2024   Perimenopausal vasomotor symptoms 02/13/2024   Panic attacks 02/13/2024   Use of cannabis 02/13/2024   Neuropathy 02/13/2024   PTSD (post-traumatic stress disorder) 02/13/2024   Irregular menses 01/29/2024   Crohn's disease (HCC) 01/29/2024   Multiple sclerosis 01/17/2024   Spontaneous pneumothorax 09/30/2014   Cervical dysplasia 09/30/2014   Decreased hearing 09/30/2014     REFERRING PROVIDER:  Genelle Standing, MD      REFERRING DIAG:  M25.361 (ICD-10-CM) - Patellar instability of right knee      S/p PROCEDURE: 1. Right knee medial patellofemoral ligament reconstruction  Rationale for  Evaluation and Treatment: Rehabilitation  THERAPY DIAG:  Acute pain of right knee  Difficulty in walking, not elsewhere classified  Muscle weakness (generalized)  ONSET DATE: DOS 12/9   SUBJECTIVE:                                                                                                                                                                                           SUBJECTIVE STATEMENT: Pt states that she forgot her brace at home due to being in a  hurry. She has not been taking her pain meds as she does not want to become dependent on them.  PERTINENT HISTORY:  MS, PTSD  PAIN:  Are you having pain? Yes: NPRS scale: severe Pain location: Rt knee Pain description: severe Aggravating factors: moving, constant Relieving factors: rest, ice, meds  PRECAUTIONS:  None  RED FLAGS: None   WEIGHT BEARING RESTRICTIONS:  WBAT  FALLS:  Has patient fallen in last 6 months? No  LIVING ENVIRONMENT: Has stairs in home, 4 steps to home with daughter  OCCUPATION:  Not working  PLOF:  Independent  PATIENT GOALS:  Back to running   OBJECTIVE:  Note: Objective measures were completed at Evaluation unless otherwise noted.   PATIENT SURVEYS:  LEFS  Extreme difficulty/unable (0), Quite a bit of difficulty (1), Moderate difficulty (2), Little difficulty (3), No difficulty (4) Survey date:  EVAL 12/12  Any of your usual work, housework or school activities   2. Usual hobbies, recreational or sporting activities   3. Getting into/out of the bath   4. Walking between rooms 1  5. Putting on socks/shoes   6. Squatting    7. Lifting an object, like a bag of groceries from the floor   8. Performing light activities around your home   9. Performing heavy activities around your home   10. Getting into/out of a car 1  11. Walking 2 blocks   12. Walking 1 mile   13. Going up/down 10 stairs (1 flight)   14. Standing for 1 hour   15.  sitting for 1 hour 2  16.  Running on even ground   17. Running on uneven ground   18. Making sharp turns while running fast   19. Hopping    20. Rolling over in bed 2  Score total:  6     COGNITIVE STATUS: Within functional limits for tasks assessed   SENSATION: Numbness reported in leg/foot  EDEMA:  Yes: notable edema through leg and foot which pt says is usual for her   GAIT: Comments: EVAL  arrived with single axillary crutch- educated on using opposite of surgical side.    Body Part #1 Knee  PALPATION: EVAL: non-pitting edema, no s/s of infection  LOWER EXTREMITY ROM:   Left AROM throughout is [facility]  Active  Right eval   Knee flexion 30   Knee extension -5   Ankle dorsiflexion 0   Ankle plantarflexion    Ankle inversion    Ankle eversion     (Blank rows = not tested)   TREATMENT DATE:  08/19/24: Attempted PROM Glute sets  Discussion of protocol Ankle pumps as tolerated Re-iterated importance of knee extension and quad control Standing with bilat crutches with focus on WB as tolerated.       EVAL Changed bandages Ankle pumps in elevation Quad sets Seated heel slide- instructed on lock/unlock mechanism to use restroom.  Gait with single crutch   PATIENT EDUCATION:  Education details: Anatomy of condition, POC, HEP, exercise form/rationale Person educated: Patient Education method: Explanation, Demonstration, Tactile cues, Verbal cues, and Handouts Education comprehension: verbalized understanding, returned demonstration, verbal cues required, tactile cues required, and needs further education   HOME EXERCISE PROGRAM: Access Code: [email or web address] URL: [email or web address] Date: 08/01/2024 Prepared by: Harlene Cordon  Exercises - Ankle Pumps in Elevation  - 3-4 x daily - 7 x weekly - 3 sets - 10 reps - Supine Quad Set  - 3-4 x daily - 7 x weekly - 1 sets -  10 reps - 5s hold - Active Straight Leg Raise with Quad Set  - 3-4 x daily - 7 x weekly - 1 sets - 10  reps - Seated Heel Slide  - 3-4 x daily - 7 x weekly - 1 sets - 10 reps   ASSESSMENT:  CLINICAL IMPRESSION: Pt arrives to first f/u appt with increased pain, decreased tolerance to activity. She was unable to tolerate PROM despite cues. Session was spent in supine with pt attempting to perform ankle pumps, and glutes sets with intermittent shifting of position due to pain. Pt did not have her brace present and states that she forgot it at home. Per pt, she has spent most of her time with her leg elevated with ice. Her ice machine broke and she is awaiting a new one. Pt was encouraged to continue to perform quad sets, mobility as tolerated and continue with pain meds as needed. Pt will continue to benefit from skilled PT to address continued deficits.    REHAB POTENTIAL: Good  CLINICAL DECISION MAKING: Stable/uncomplicated  EVALUATION COMPLEXITY: Low   GOALS: Goals reviewed with patient? Yes   SHORT TERM GOALS: 09/16/2024  Ambulation with good quad set, without AD Baseline: Goal status: INITIAL   2.  ROM 0-90 without increased pain Baseline:  Goal status: INITIAL   3.  Independent with HEP as it has been established in the short term Baseline:  Goal status: INITIAL   4.  Compliance with RICE techniques to control edema Baseline:  Goal status: INITIAL    LONG TERM GOALS:  SLS on involved LE with good control for 20s Baseline: unable at eval Goal status: INITIAL   Target date: 09/26/2024  8 weeks  2.  No longer using AD for household and shorter community distances Baseline: requires AD at eval Goal status: INITIAL  Target date: 09/26/2024  8 weeks  3.  Demonstrate reciprocal stair navigation without compensation Baseline: unable at eval Goal status: INITIAL  Target date: 10/24/2024  12 weeks  4.  Lunges and squats with good control of varus valgus motion Baseline: unable at eval Goal status: INITIAL  Target date: 10/24/2024  12 weeks  5.  LE MMT via handheld  dynamometry or Tindeq to 85% of uninvolved LE Baseline: not appropriate to test at eval Goal status: INITIAL  Target date: 11/21/24 16 weeks  6. Demo proper form with running Baseline: unable at eval Goal status: INITIAL  Target date: 11/21/24 6 months      PLAN:  PT FREQUENCY: 1-2x/week  PT DURATION: POC date  PLANNED INTERVENTIONS: 97164- PT Re-evaluation, 97750- Physical Performance Testing, 97110-Therapeutic exercises, 97530- Therapeutic activity, 97112- Neuromuscular re-education, 97535- Self Care, 02859- Manual therapy, [phone]- Gait training, [phone]- Aquatic Therapy, [phone] (1-2 muscles), 20561 (3+ muscles)- Dry Needling, Patient/Family education, Balance training, Stair training, Taping, Joint mobilization, Spinal mobilization, Scar mobilization, and Cryotherapy.  PLAN FOR NEXT SESSION: per MPFL protocol, requested that she bring shoes that hold her heel   Rojean JONELLE Batten, PT, DPT 08/19/2024, 1:10 PM  For all possible CPT codes, reference the Planned Interventions line above.     Check all conditions that are expected to impact treatment: {Conditions expected to impact treatment:Neurological condition and/or seizures   If treatment provided at initial evaluation, no treatment charged due to lack of authorization.       "

## 2024-08-23 NOTE — Therapy (Unsigned)
 " OUTPATIENT PHYSICAL THERAPY TREATMENT   Patient Name: Gloria Ramos MRN: [id number] DOB:1976-05-30, 49 y.o., female Today's Date: 08/25/2024  END OF SESSION:  PT End of Session - 08/25/24 0934     Visit Number 3    Number of Visits 21    Date for Recertification  10/26/23    Authorization Type [employer] [employer]    PT Start Time 0932    PT Stop Time 1014    PT Time Calculation (min) 42 min    Activity Tolerance Patient tolerated treatment well;Patient limited by pain    Behavior During Therapy [facility] for tasks assessed/performed            Past Medical History:  Diagnosis Date   Anemia    Arthritis    Cervical cancer (HCC)    Colitis    Collapsed lung    Crohn's disease (HCC)    Edema    Multiple sclerosis    Pelvic pain in female 09/30/2014   PTSD (post-traumatic stress disorder)    Rheumatoid arthritis (HCC)    Past Surgical History:  Procedure Laterality Date   ABDOMINAL SURGERY     CERVICAL CONE BIOPSY  2015   MEDIAL PATELLOFEMORAL LIGAMENT REPAIR Right 07/29/2024   Procedure: Right knee medial patellofemoral ligament reconstruction;  Surgeon: Genelle Standing, MD;  Location: [facility];  Service: Orthopedics;  Laterality: Right;   TUBAL LIGATION     Patient Active Problem List   Diagnosis Date Noted   Patellar instability of right knee 07/29/2024   Perimenopausal vasomotor symptoms 02/13/2024   Panic attacks 02/13/2024   Use of cannabis 02/13/2024   Neuropathy 02/13/2024   PTSD (post-traumatic stress disorder) 02/13/2024   Irregular menses 01/29/2024   Crohn's disease (HCC) 01/29/2024   Multiple sclerosis 01/17/2024   Spontaneous pneumothorax 09/30/2014   Cervical dysplasia 09/30/2014   Decreased hearing 09/30/2014     REFERRING PROVIDER:  Genelle Standing, MD      REFERRING DIAG:  M25.361 (ICD-10-CM) - Patellar instability of right knee      S/p PROCEDURE: 1. Right knee medial patellofemoral ligament reconstruction  Rationale for  Evaluation and Treatment: Rehabilitation  THERAPY DIAG:  Acute pain of right knee  Difficulty in walking, not elsewhere classified  Muscle weakness (generalized)  ONSET DATE: DOS 12/9   SUBJECTIVE:                                                                                                                                                                                           SUBJECTIVE STATEMENT: I have been really working on it at home.   PERTINENT HISTORY:  MS, PTSD  PAIN:  Are you having pain? Yes: NPRS scale: severe Pain location: Rt knee Pain description: severe Aggravating factors: moving, constant Relieving factors: rest, ice, meds  PRECAUTIONS:  None  RED FLAGS: None   WEIGHT BEARING RESTRICTIONS:  WBAT  FALLS:  Has patient fallen in last 6 months? No  LIVING ENVIRONMENT: Has stairs in home, 4 steps to home with daughter  OCCUPATION:  Not working  PLOF:  Independent  PATIENT GOALS:  Back to running   OBJECTIVE:  Note: Objective measures were completed at Evaluation unless otherwise noted.   PATIENT SURVEYS:  LEFS  Extreme difficulty/unable (0), Quite a bit of difficulty (1), Moderate difficulty (2), Little difficulty (3), No difficulty (4) Survey date:  EVAL 12/12  Any of your usual work, housework or school activities   2. Usual hobbies, recreational or sporting activities   3. Getting into/out of the bath   4. Walking between rooms 1  5. Putting on socks/shoes   6. Squatting    7. Lifting an object, like a bag of groceries from the floor   8. Performing light activities around your home   9. Performing heavy activities around your home   10. Getting into/out of a car 1  11. Walking 2 blocks   12. Walking 1 mile   13. Going up/down 10 stairs (1 flight)   14. Standing for 1 hour   15.  sitting for 1 hour 2  16. Running on even ground   17. Running on uneven ground   18. Making sharp turns while running fast   19. Hopping     20. Rolling over in bed 2  Score total:  6     COGNITIVE STATUS: Within functional limits for tasks assessed   SENSATION: Numbness reported in leg/foot  EDEMA:  Yes: notable edema through leg and foot which pt says is usual for her   GAIT: Comments: EVAL  arrived with single axillary crutch- educated on using opposite of surgical side.    Body Part #1 Knee  PALPATION: EVAL: non-pitting edema, no s/s of infection  LOWER EXTREMITY ROM:   Left AROM throughout is [facility]  Active  Right eval   Knee flexion 30   Knee extension -5   Ankle dorsiflexion 0   Ankle plantarflexion    Ankle inversion    Ankle eversion     (Blank rows = not tested)   TREATMENT DATE:  08/25/24 AAROM into flexion to tolerance Quad sets with towel roll under knee to tolerance  SLR with extension lag Supine abduction x5  Step up/down onto 6 step  Heel to toe gait around clinic with 1x axillary crutch Sit to stand from hi- low table with focus on keeping knee bent and foot underneath her.    08/19/24: Attempted PROM Glute sets  Discussion of protocol Ankle pumps as tolerated Re-iterated importance of knee extension and quad control Standing with bilat crutches with focus on WB as tolerated.       EVAL Changed bandages Ankle pumps in elevation Quad sets Seated heel slide- instructed on lock/unlock mechanism to use restroom.  Gait with single crutch   PATIENT EDUCATION:  Education details: Anatomy of condition, POC, HEP, exercise form/rationale Person educated: Patient Education method: Explanation, Demonstration, Tactile cues, Verbal cues, and Handouts Education comprehension: verbalized understanding, returned demonstration, verbal cues required, tactile cues required, and needs further education   HOME EXERCISE PROGRAM: Access Code: [email or web address] URL: [email or web address] Date: 08/01/2024 Prepared by: Harlene Cordon  Exercises - Ankle Pumps in Elevation  - 3-4  x daily - 7 x weekly - 3 sets - 10 reps - Supine Quad Set  - 3-4 x daily - 7 x weekly - 1 sets - 10 reps - 5s hold - Active Straight Leg Raise with Quad Set  - 3-4 x daily - 7 x weekly - 1 sets - 10 reps - Seated Heel Slide  - 3-4 x daily - 7 x weekly - 1 sets - 10 reps   ASSESSMENT:  CLINICAL IMPRESSION: Pt tolerated session fairly today. She arrived with 1 axillary crutch and no brace. She was able to perform AAROM into flexion and extension, but was limited by pain. Pt unable to achieve full flexion today, but made improvements as session progressed. She has hypersensitivity over scar.  Session with focus on muscle activation, AAROM as tolerated and gait with focus on heel to toe and stairs with 1 axillary crutch. Pt has increased difficulty due to lack of knee extension. Encouraged self desensitization, quad activation, walking and intermittent icing at home. Pt will continue to benefit from skilled PT to address continued deficits.     REHAB POTENTIAL: Good  CLINICAL DECISION MAKING: Stable/uncomplicated  EVALUATION COMPLEXITY: Low   GOALS: Goals reviewed with patient? Yes   SHORT TERM GOALS: 09/22/2024  Ambulation with good quad set, without AD Baseline: Goal status: INITIAL   2.  ROM 0-90 without increased pain Baseline:  Goal status: INITIAL   3.  Independent with HEP as it has been established in the short term Baseline:  Goal status: INITIAL   4.  Compliance with RICE techniques to control edema Baseline:  Goal status: INITIAL    LONG TERM GOALS:  SLS on involved LE with good control for 20s Baseline: unable at eval Goal status: INITIAL   Target date: 09/26/2024  8 weeks  2.  No longer using AD for household and shorter community distances Baseline: requires AD at eval Goal status: INITIAL  Target date: 09/26/2024  8 weeks  3.  Demonstrate reciprocal stair navigation without compensation Baseline: unable at eval Goal status: INITIAL  Target date: 10/24/2024   12 weeks  4.  Lunges and squats with good control of varus valgus motion Baseline: unable at eval Goal status: INITIAL  Target date: 10/24/2024  12 weeks  5.  LE MMT via handheld dynamometry or Tindeq to 85% of uninvolved LE Baseline: not appropriate to test at eval Goal status: INITIAL  Target date: 11/21/24 16 weeks  6. Demo proper form with running Baseline: unable at eval Goal status: INITIAL  Target date: 11/21/24 6 months      PLAN:  PT FREQUENCY: 1-2x/week  PT DURATION: POC date  PLANNED INTERVENTIONS: 97164- PT Re-evaluation, 97750- Physical Performance Testing, 97110-Therapeutic exercises, 97530- Therapeutic activity, 97112- Neuromuscular re-education, 97535- Self Care, 02859- Manual therapy, [phone]- Gait training, [phone]- Aquatic Therapy, [phone] (1-2 muscles), 20561 (3+ muscles)- Dry Needling, Patient/Family education, Balance training, Stair training, Taping, Joint mobilization, Spinal mobilization, Scar mobilization, and Cryotherapy.  PLAN FOR NEXT SESSION: per MPFL protocol, requested that she bring shoes that hold her heel   Gloria Ramos, PT, DPT 08/25/2024, 10:24 AM  For all possible CPT codes, reference the Planned Interventions line above.     Check all conditions that are expected to impact treatment: {Conditions expected to impact treatment:Neurological condition and/or seizures   If treatment provided at initial evaluation, no treatment charged due to lack of authorization.       "

## 2024-08-24 NOTE — Progress Notes (Signed)
 This encounter was created in error - please disregard.

## 2024-08-25 ENCOUNTER — Ambulatory Visit (HOSPITAL_BASED_OUTPATIENT_CLINIC_OR_DEPARTMENT_OTHER): Attending: Orthopaedic Surgery | Admitting: Physical Therapy

## 2024-08-25 ENCOUNTER — Encounter (HOSPITAL_BASED_OUTPATIENT_CLINIC_OR_DEPARTMENT_OTHER): Payer: Self-pay | Admitting: Physical Therapy

## 2024-08-25 DIAGNOSIS — M25561 Pain in right knee: Secondary | ICD-10-CM | POA: Insufficient documentation

## 2024-08-25 DIAGNOSIS — R262 Difficulty in walking, not elsewhere classified: Secondary | ICD-10-CM | POA: Insufficient documentation

## 2024-08-25 DIAGNOSIS — M6281 Muscle weakness (generalized): Secondary | ICD-10-CM | POA: Diagnosis present

## 2024-08-25 NOTE — Therapy (Unsigned)
 " OUTPATIENT PHYSICAL THERAPY TREATMENT   Patient Name: Gloria Ramos MRN: [id number] DOB:July 05, 1976, 49 y.o., female Today's Date: 08/27/2024  END OF SESSION:  Gloria Ramos End of Session - 08/27/24 1155     Visit Number 4    Number of Visits 21    Date for Recertification  10/26/23    Authorization Type [place] [employer]    Gloria Ramos Start Time 1152    Gloria Ramos Stop Time 1230    Gloria Ramos Time Calculation (min) 38 min    Activity Tolerance Patient tolerated treatment well    Behavior During Therapy WFL for tasks assessed/performed             Past Medical History:  Diagnosis Date   Anemia    Arthritis    Cervical cancer (HCC)    Colitis    Collapsed lung    Crohn's disease (HCC)    Edema    Multiple sclerosis    Pelvic pain in female 09/30/2014   PTSD (post-traumatic stress disorder)    Rheumatoid arthritis (HCC)    Past Surgical History:  Procedure Laterality Date   ABDOMINAL SURGERY     CERVICAL CONE BIOPSY  2015   MEDIAL PATELLOFEMORAL LIGAMENT REPAIR Right 07/29/2024   Procedure: Right knee medial patellofemoral ligament reconstruction;  Surgeon: Genelle Standing, MD;  Location: [facility];  Service: Orthopedics;  Laterality: Right;   TUBAL LIGATION     Patient Active Problem List   Diagnosis Date Noted   Patellar instability of right knee 07/29/2024   Perimenopausal vasomotor symptoms 02/13/2024   Panic attacks 02/13/2024   Use of cannabis 02/13/2024   Neuropathy 02/13/2024   PTSD (post-traumatic stress disorder) 02/13/2024   Irregular menses 01/29/2024   Crohn's disease (HCC) 01/29/2024   Multiple sclerosis 01/17/2024   Spontaneous pneumothorax 09/30/2014   Cervical dysplasia 09/30/2014   Decreased hearing 09/30/2014     REFERRING PROVIDER:  Genelle Standing, MD      REFERRING DIAG:  M25.361 (ICD-10-CM) - Patellar instability of right knee      S/p PROCEDURE: 1. Right knee medial patellofemoral ligament reconstruction  Rationale for Evaluation and Treatment:  Rehabilitation  THERAPY DIAG:  Acute pain of right knee  Difficulty in walking, not elsewhere classified  Muscle weakness (generalized)  ONSET DATE: DOS 12/9   SUBJECTIVE:                                                                                                                                                                                           SUBJECTIVE STATEMENT: I have been working on rubbing my leg like you asked.   PERTINENT HISTORY:  MS, PTSD  PAIN:  Are you having pain? Yes: NPRS scale: severe Pain location: Rt knee Pain description: severe Aggravating factors: moving, constant Relieving factors: rest, ice, meds  PRECAUTIONS:  None  RED FLAGS: None   WEIGHT BEARING RESTRICTIONS:  WBAT  FALLS:  Has patient fallen in last 6 months? No  LIVING ENVIRONMENT: Has stairs in home, 4 steps to home with daughter  OCCUPATION:  Not working  PLOF:  Independent  PATIENT GOALS:  Back to running   OBJECTIVE:  Note: Objective measures were completed at Evaluation unless otherwise noted.   PATIENT SURVEYS:  LEFS  Extreme difficulty/unable (0), Quite a bit of difficulty (1), Moderate difficulty (2), Little difficulty (3), No difficulty (4) Survey date:  EVAL 12/12  Any of your usual work, housework or school activities   2. Usual hobbies, recreational or sporting activities   3. Getting into/out of the bath   4. Walking between rooms 1  5. Putting on socks/shoes   6. Squatting    7. Lifting an object, like a bag of groceries from the floor   8. Performing light activities around your home   9. Performing heavy activities around your home   10. Getting into/out of a car 1  11. Walking 2 blocks   12. Walking 1 mile   13. Going up/down 10 stairs (1 flight)   14. Standing for 1 hour   15.  sitting for 1 hour 2  16. Running on even ground   17. Running on uneven ground   18. Making sharp turns while running fast   19. Hopping    20. Rolling  over in bed 2  Score total:  6     COGNITIVE STATUS: Within functional limits for tasks assessed   SENSATION: Numbness reported in leg/foot  EDEMA:  Yes: notable edema through leg and foot which Gloria Ramos says is usual for her   GAIT: Comments: EVAL  arrived with single axillary crutch- educated on using opposite of surgical side.    Body Part #1 Knee  PALPATION: EVAL: non-pitting edema, no s/s of infection  LOWER EXTREMITY ROM:   Left AROM throughout is [facility]  Active  Right eval   Knee flexion 30   Knee extension -5   Ankle dorsiflexion 0   Ankle plantarflexion    Ankle inversion    Ankle eversion     (Blank rows = not tested)   TREATMENT DATE:  08/27/24 AROM into flexion/ Ext Quat sets -- Quad sets with SLR  Bridges to tolerance Scar mobs, effleurage NuStep lvl 1.   08/25/24 AAROM into flexion to tolerance Quad sets with towel roll under knee to tolerance  SLR with extension lag Supine abduction x5  Step up/down onto 6 step  Heel to toe gait around clinic with 1x axillary crutch Sit to stand from hi- low table with focus on keeping knee bent and foot underneath her.    08/19/24: Attempted PROM Glute sets  Discussion of protocol Ankle pumps as tolerated Re-iterated importance of knee extension and quad control Standing with bilat crutches with focus on WB as tolerated.       EVAL Changed bandages Ankle pumps in elevation Quad sets Seated heel slide- instructed on lock/unlock mechanism to use restroom.  Gait with single crutch   PATIENT EDUCATION:  Education details: Anatomy of condition, POC, HEP, exercise form/rationale Person educated: Patient Education method: Explanation, Demonstration, Tactile cues, Verbal cues, and Handouts Education comprehension: verbalized understanding, returned demonstration, verbal cues required,  tactile cues required, and needs further education   HOME EXERCISE PROGRAM: Access Code: [email or web address] URL:  [email or web address] Date: 08/01/2024 Prepared by: Harlene Cordon  Exercises - Ankle Pumps in Elevation  - 3-4 x daily - 7 x weekly - 3 sets - 10 reps - Supine Quad Set  - 3-4 x daily - 7 x weekly - 1 sets - 10 reps - 5s hold - Active Straight Leg Raise with Quad Set  - 3-4 x daily - 7 x weekly - 1 sets - 10 reps - Seated Heel Slide  - 3-4 x daily - 7 x weekly - 1 sets - 10 reps   ASSESSMENT:  CLINICAL IMPRESSION: Apt truncated due to late arrival of Gloria Ramos. She is demonstrating improvements overall with pain tolerance and desentizitation surrounding scars. She is limited secondary to pain and swelling. Encouraged continued ice, elevation for 30 min a day with ankle pumps to get fluid out. Gloria Ramos will continue to benefit from skilled Gloria Ramos to address continued deficits.      REHAB POTENTIAL: Good  CLINICAL DECISION MAKING: Stable/uncomplicated  EVALUATION COMPLEXITY: Low   GOALS: Goals reviewed with patient? Yes   SHORT TERM GOALS: 09/24/2024  Ambulation with good quad set, without AD Baseline: Goal status: INITIAL   2.  ROM 0-90 without increased pain Baseline:  Goal status: INITIAL   3.  Independent with HEP as it has been established in the short term Baseline:  Goal status: INITIAL   4.  Compliance with RICE techniques to control edema Baseline:  Goal status: INITIAL    LONG TERM GOALS:  SLS on involved LE with good control for 20s Baseline: unable at eval Goal status: INITIAL   Target date: 09/26/2024  8 weeks  2.  No longer using AD for household and shorter community distances Baseline: requires AD at eval Goal status: INITIAL  Target date: 09/26/2024  8 weeks  3.  Demonstrate reciprocal stair navigation without compensation Baseline: unable at eval Goal status: INITIAL  Target date: 10/24/2024  12 weeks  4.  Lunges and squats with good control of varus valgus motion Baseline: unable at eval Goal status: INITIAL  Target date: 10/24/2024  12  weeks  5.  LE MMT via handheld dynamometry or Tindeq to 85% of uninvolved LE Baseline: not appropriate to test at eval Goal status: INITIAL  Target date: 11/21/24 16 weeks  6. Demo proper form with running Baseline: unable at eval Goal status: INITIAL  Target date: 11/21/24 6 months      PLAN:  Gloria Ramos FREQUENCY: 1-2x/week  Gloria Ramos DURATION: POC date  PLANNED INTERVENTIONS: 97164- Gloria Ramos Re-evaluation, 97750- Physical Performance Testing, 97110-Therapeutic exercises, 97530- Therapeutic activity, 97112- Neuromuscular re-education, 97535- Self Care, 02859- Manual therapy, [phone]- Gait training, [phone]- Aquatic Therapy, [phone] (1-2 muscles), 20561 (3+ muscles)- Dry Needling, Patient/Family education, Balance training, Stair training, Taping, Joint mobilization, Spinal mobilization, Scar mobilization, and Cryotherapy.  PLAN FOR NEXT SESSION: per MPFL protocol, requested that she bring shoes that hold her heel   Rojean JONELLE Batten, Gloria Ramos, DPT 08/27/2024, 12:30 PM  For all possible CPT codes, reference the Planned Interventions line above.     Check all conditions that are expected to impact treatment: {Conditions expected to impact treatment:Neurological condition and/or seizures   If treatment provided at initial evaluation, no treatment charged due to lack of authorization.       "

## 2024-08-26 ENCOUNTER — Ambulatory Visit: Payer: Self-pay | Admitting: Neurology

## 2024-08-27 ENCOUNTER — Ambulatory Visit (HOSPITAL_BASED_OUTPATIENT_CLINIC_OR_DEPARTMENT_OTHER): Admitting: Physical Therapy

## 2024-08-27 ENCOUNTER — Encounter (HOSPITAL_BASED_OUTPATIENT_CLINIC_OR_DEPARTMENT_OTHER): Payer: Self-pay | Admitting: Physical Therapy

## 2024-08-27 DIAGNOSIS — M6281 Muscle weakness (generalized): Secondary | ICD-10-CM

## 2024-08-27 DIAGNOSIS — R262 Difficulty in walking, not elsewhere classified: Secondary | ICD-10-CM

## 2024-08-27 DIAGNOSIS — M25561 Pain in right knee: Secondary | ICD-10-CM | POA: Diagnosis not present

## 2024-08-28 ENCOUNTER — Encounter (HOSPITAL_COMMUNITY): Admitting: Psychiatry

## 2024-08-28 ENCOUNTER — Encounter (HOSPITAL_COMMUNITY): Payer: Self-pay

## 2024-09-01 ENCOUNTER — Other Ambulatory Visit (HOSPITAL_COMMUNITY): Payer: Self-pay

## 2024-09-01 ENCOUNTER — Ambulatory Visit (HOSPITAL_BASED_OUTPATIENT_CLINIC_OR_DEPARTMENT_OTHER): Admitting: Physical Therapy

## 2024-09-01 ENCOUNTER — Encounter (HOSPITAL_BASED_OUTPATIENT_CLINIC_OR_DEPARTMENT_OTHER): Payer: Self-pay

## 2024-09-01 NOTE — Therapy (Deleted)
 " OUTPATIENT PHYSICAL THERAPY TREATMENT   Patient Name: Gloria Ramos MRN: [id number] DOB:June 23, 1976, 49 y.o., female Today's Date: 09/01/2024  END OF SESSION:       Past Medical History:  Diagnosis Date   Anemia    Arthritis    Cervical cancer (HCC)    Colitis    Collapsed lung    Crohn's disease (HCC)    Edema    Multiple sclerosis    Pelvic pain in female 09/30/2014   PTSD (post-traumatic stress disorder)    Rheumatoid arthritis (HCC)    Past Surgical History:  Procedure Laterality Date   ABDOMINAL SURGERY     CERVICAL CONE BIOPSY  2015   MEDIAL PATELLOFEMORAL LIGAMENT REPAIR Right 07/29/2024   Procedure: Right knee medial patellofemoral ligament reconstruction;  Surgeon: Genelle Standing, MD;  Location:  [facility];  Service: Orthopedics;  Laterality: Right;   TUBAL LIGATION     Patient Active Problem List   Diagnosis Date Noted   Patellar instability of right knee 07/29/2024   Perimenopausal vasomotor symptoms 02/13/2024   Panic attacks 02/13/2024   Use of cannabis 02/13/2024   Neuropathy 02/13/2024   PTSD (post-traumatic stress disorder) 02/13/2024   Irregular menses 01/29/2024   Crohn's disease (HCC) 01/29/2024   Multiple sclerosis 01/17/2024   Spontaneous pneumothorax 09/30/2014   Cervical dysplasia 09/30/2014   Decreased hearing 09/30/2014     REFERRING PROVIDER:  Genelle Standing, MD      REFERRING DIAG:  M25.361 (ICD-10-CM) - Patellar instability of right knee      S/p PROCEDURE: 1. Right knee medial patellofemoral ligament reconstruction  Rationale for Evaluation and Treatment: Rehabilitation  THERAPY DIAG:  No diagnosis found.  ONSET DATE: DOS 12/9   SUBJECTIVE:                                                                                                                                                                                           SUBJECTIVE STATEMENT: I have been working on rubbing my leg like you asked.    PERTINENT HISTORY:  MS, PTSD  PAIN:  Are you having pain? Yes: NPRS scale: severe Pain location: Rt knee Pain description: severe Aggravating factors: moving, constant Relieving factors: rest, ice, meds  PRECAUTIONS:  None  RED FLAGS: None   WEIGHT BEARING RESTRICTIONS:  WBAT  FALLS:  Has patient fallen in last 6 months? No  LIVING ENVIRONMENT: Has stairs in home, 4 steps to home with daughter  OCCUPATION:  Not working  PLOF:  Independent  PATIENT GOALS:  Back to running   OBJECTIVE:  Note: Objective measures were completed at Evaluation unless otherwise noted.  PATIENT SURVEYS:  LEFS  Extreme difficulty/unable (0), Quite a bit of difficulty (1), Moderate difficulty (2), Little difficulty (3), No difficulty (4) Survey date:  EVAL 12/12  Any of your usual work, housework or school activities   2. Usual hobbies, recreational or sporting activities   3. Getting into/out of the bath   4. Walking between rooms 1  5. Putting on socks/shoes   6. Squatting    7. Lifting an object, like a bag of groceries from the floor   8. Performing light activities around your home   9. Performing heavy activities around your home   10. Getting into/out of a car 1  11. Walking 2 blocks   12. Walking 1 mile   13. Going up/down 10 stairs (1 flight)   14. Standing for 1 hour   15.  sitting for 1 hour 2  16. Running on even ground   17. Running on uneven ground   18. Making sharp turns while running fast   19. Hopping    20. Rolling over in bed 2  Score total:  6     COGNITIVE STATUS: Within functional limits for tasks assessed   SENSATION: Numbness reported in leg/foot  EDEMA:  Yes: notable edema through leg and foot which pt says is usual for her   GAIT: Comments: EVAL  arrived with single axillary crutch- educated on using opposite of surgical side.    Body Part #1 Knee  PALPATION: EVAL: non-pitting edema, no s/s of infection  LOWER EXTREMITY ROM:    Left AROM throughout is [facility]  Active  Right eval   Knee flexion 30   Knee extension -5   Ankle dorsiflexion 0   Ankle plantarflexion    Ankle inversion    Ankle eversion     (Blank rows = not tested)   TREATMENT DATE:  08/27/24 AROM into flexion/ Ext Quat sets -- Quad sets with SLR  Bridges to tolerance Scar mobs, effleurage NuStep lvl 1.   08/25/24 AAROM into flexion to tolerance Quad sets with towel roll under knee to tolerance  SLR with extension lag Supine abduction x5  Step up/down onto 6 step  Heel to toe gait around clinic with 1x axillary crutch Sit to stand from hi- low table with focus on keeping knee bent and foot underneath her.    08/19/24: Attempted PROM Glute sets  Discussion of protocol Ankle pumps as tolerated Re-iterated importance of knee extension and quad control Standing with bilat crutches with focus on WB as tolerated.       EVAL Changed bandages Ankle pumps in elevation Quad sets Seated heel slide- instructed on lock/unlock mechanism to use restroom.  Gait with single crutch   PATIENT EDUCATION:  Education details: Anatomy of condition, POC, HEP, exercise form/rationale Person educated: Patient Education method: Explanation, Demonstration, Tactile cues, Verbal cues, and Handouts Education comprehension: verbalized understanding, returned demonstration, verbal cues required, tactile cues required, and needs further education   HOME EXERCISE PROGRAM: Access Code: [email or web address] URL: [email or web address] Date: 08/01/2024 Prepared by: Harlene Cordon  Exercises - Ankle Pumps in Elevation  - 3-4 x daily - 7 x weekly - 3 sets - 10 reps - Supine Quad Set  - 3-4 x daily - 7 x weekly - 1 sets - 10 reps - 5s hold - Active Straight Leg Raise with Quad Set  - 3-4 x daily - 7 x weekly - 1 sets - 10 reps - Seated Heel Slide  - 3-4 x daily -  7 x weekly - 1 sets - 10 reps   ASSESSMENT:  CLINICAL IMPRESSION: Apt truncated due  to late arrival of pt. She is demonstrating improvements overall with pain tolerance and desentizitation surrounding scars. She is limited secondary to pain and swelling. Encouraged continued ice, elevation for 30 min a day with ankle pumps to get fluid out. Pt will continue to benefit from skilled PT to address continued deficits.      REHAB POTENTIAL: Good  CLINICAL DECISION MAKING: Stable/uncomplicated  EVALUATION COMPLEXITY: Low   GOALS: Goals reviewed with patient? Yes   SHORT TERM GOALS: 09/29/2024  Ambulation with good quad set, without AD Baseline: Goal status: INITIAL   2.  ROM 0-90 without increased pain Baseline:  Goal status: INITIAL   3.  Independent with HEP as it has been established in the short term Baseline:  Goal status: INITIAL   4.  Compliance with RICE techniques to control edema Baseline:  Goal status: INITIAL    LONG TERM GOALS:  SLS on involved LE with good control for 20s Baseline: unable at eval Goal status: INITIAL   Target date: 09/26/2024  8 weeks  2.  No longer using AD for household and shorter community distances Baseline: requires AD at eval Goal status: INITIAL  Target date: 09/26/2024  8 weeks  3.  Demonstrate reciprocal stair navigation without compensation Baseline: unable at eval Goal status: INITIAL  Target date: 10/24/2024  12 weeks  4.  Lunges and squats with good control of varus valgus motion Baseline: unable at eval Goal status: INITIAL  Target date: 10/24/2024  12 weeks  5.  LE MMT via handheld dynamometry or Tindeq to 85% of uninvolved LE Baseline: not appropriate to test at eval Goal status: INITIAL  Target date: 11/21/24 16 weeks  6. Demo proper form with running Baseline: unable at eval Goal status: INITIAL  Target date: 11/21/24 6 months      PLAN:  PT FREQUENCY: 1-2x/week  PT DURATION: POC date  PLANNED INTERVENTIONS: 97164- PT Re-evaluation, 97750- Physical Performance Testing, 97110-Therapeutic  exercises, 97530- Therapeutic activity, 97112- Neuromuscular re-education, 97535- Self Care, [id number]- Manual therapy, [phone]- Gait training, [phone]- Aquatic Therapy, [phone] (1-2 muscles), [id number] (3+ muscles)- Dry Needling, Patient/Family education, Balance training, Stair training, Taping, Joint mobilization, Spinal mobilization, Scar mobilization, and Cryotherapy.  PLAN FOR NEXT SESSION: per MPFL protocol, requested that she bring shoes that hold her heel   Rojean JONELLE Batten, PT, DPT 09/01/2024, 10:45 AM  For all possible CPT codes, reference the Planned Interventions line above.     Check all conditions that are expected to impact treatment: {Conditions expected to impact treatment:Neurological condition and/or seizures   If treatment provided at initial evaluation, no treatment charged due to lack of authorization.       "

## 2024-09-03 ENCOUNTER — Ambulatory Visit (HOSPITAL_BASED_OUTPATIENT_CLINIC_OR_DEPARTMENT_OTHER): Admitting: Physical Therapy

## 2024-09-03 ENCOUNTER — Encounter (HOSPITAL_BASED_OUTPATIENT_CLINIC_OR_DEPARTMENT_OTHER): Payer: Self-pay

## 2024-09-08 ENCOUNTER — Other Ambulatory Visit: Payer: Self-pay | Admitting: Family

## 2024-09-08 DIAGNOSIS — S41102A Unspecified open wound of left upper arm, initial encounter: Secondary | ICD-10-CM

## 2024-09-09 ENCOUNTER — Other Ambulatory Visit (HOSPITAL_COMMUNITY): Payer: Self-pay

## 2024-09-10 ENCOUNTER — Ambulatory Visit (HOSPITAL_BASED_OUTPATIENT_CLINIC_OR_DEPARTMENT_OTHER): Admitting: Physical Therapy

## 2024-09-10 DIAGNOSIS — R262 Difficulty in walking, not elsewhere classified: Secondary | ICD-10-CM

## 2024-09-10 DIAGNOSIS — M6281 Muscle weakness (generalized): Secondary | ICD-10-CM

## 2024-09-10 DIAGNOSIS — M25561 Pain in right knee: Secondary | ICD-10-CM

## 2024-09-10 NOTE — Therapy (Signed)
 " OUTPATIENT PHYSICAL THERAPY TREATMENT   Patient Name: Gloria Ramos MRN: [id number] DOB:Dec 31, 1975, 49 y.o., female Today's Date: 09/10/2024  END OF SESSION:  PT End of Session - 09/10/24 1024     Visit Number 5    Number of Visits 21    Date for Recertification  10/26/23    Authorization Type [place] [employer]    PT Start Time 1017    PT Stop Time 1058    PT Time Calculation (min) 41 min    Activity Tolerance Patient tolerated treatment well    Behavior During Therapy WFL for tasks assessed/performed              Past Medical History:  Diagnosis Date   Anemia    Arthritis    Cervical cancer (HCC)    Colitis    Collapsed lung    Crohn's disease (HCC)    Edema    Multiple sclerosis    Pelvic pain in female 09/30/2014   PTSD (post-traumatic stress disorder)    Rheumatoid arthritis (HCC)    Past Surgical History:  Procedure Laterality Date   ABDOMINAL SURGERY     CERVICAL CONE BIOPSY  2015   MEDIAL PATELLOFEMORAL LIGAMENT REPAIR Right 07/29/2024   Procedure: Right knee medial patellofemoral ligament reconstruction;  Surgeon: Genelle Standing, MD;  Location: [facility];  Service: Orthopedics;  Laterality: Right;   TUBAL LIGATION     Patient Active Problem List   Diagnosis Date Noted   Patellar instability of right knee 07/29/2024   Perimenopausal vasomotor symptoms 02/13/2024   Panic attacks 02/13/2024   Use of cannabis 02/13/2024   Neuropathy 02/13/2024   PTSD (post-traumatic stress disorder) 02/13/2024   Irregular menses 01/29/2024   Crohn's disease (HCC) 01/29/2024   Multiple sclerosis 01/17/2024   Spontaneous pneumothorax 09/30/2014   Cervical dysplasia 09/30/2014   Decreased hearing 09/30/2014     REFERRING PROVIDER:  Genelle Standing, MD      REFERRING DIAG:  M25.361 (ICD-10-CM) - Patellar instability of right knee      S/p PROCEDURE: 1. Right knee medial patellofemoral ligament reconstruction  Rationale for Evaluation and  Treatment: Rehabilitation  THERAPY DIAG:  Difficulty in walking, not elsewhere classified  Muscle weakness (generalized)  Acute pain of right knee  ONSET DATE: DOS 12/9   SUBJECTIVE:                                                                                                                                                                                           SUBJECTIVE STATEMENT: I have been having a lot pain when sleeping. It keeps me up at  night, I haven't slept in 2 weeks.   PERTINENT HISTORY:  MS, PTSD  PAIN:  Are you having pain? Yes: NPRS scale: severe Pain location: Rt knee Pain description: severe Aggravating factors: moving, constant Relieving factors: rest, ice, meds  PRECAUTIONS:  None  RED FLAGS: None   WEIGHT BEARING RESTRICTIONS:  WBAT  FALLS:  Has patient fallen in last 6 months? No  LIVING ENVIRONMENT: Has stairs in home, 4 steps to home with daughter  OCCUPATION:  Not working  PLOF:  Independent  PATIENT GOALS:  Back to running   OBJECTIVE:  Note: Objective measures were completed at Evaluation unless otherwise noted.   PATIENT SURVEYS:  LEFS  Extreme difficulty/unable (0), Quite a bit of difficulty (1), Moderate difficulty (2), Little difficulty (3), No difficulty (4) Survey date:  EVAL 12/12  Any of your usual work, housework or school activities   2. Usual hobbies, recreational or sporting activities   3. Getting into/out of the bath   4. Walking between rooms 1  5. Putting on socks/shoes   6. Squatting    7. Lifting an object, like a bag of groceries from the floor   8. Performing light activities around your home   9. Performing heavy activities around your home   10. Getting into/out of a car 1  11. Walking 2 blocks   12. Walking 1 mile   13. Going up/down 10 stairs (1 flight)   14. Standing for 1 hour   15.  sitting for 1 hour 2  16. Running on even ground   17. Running on uneven ground   18. Making sharp  turns while running fast   19. Hopping    20. Rolling over in bed 2  Score total:  6     COGNITIVE STATUS: Within functional limits for tasks assessed   SENSATION: Numbness reported in leg/foot  EDEMA:  Yes: notable edema through leg and foot which pt says is usual for her   GAIT: Comments: EVAL  arrived with single axillary crutch- educated on using opposite of surgical side.    Body Part #1 Knee  PALPATION: EVAL: non-pitting edema, no s/s of infection  LOWER EXTREMITY ROM:   Left AROM throughout is [facility]  Active  Right eval   Knee flexion 30   Knee extension -5   Ankle dorsiflexion 0   Ankle plantarflexion    Ankle inversion    Ankle eversion     (Blank rows = not tested)   TREATMENT DATE:  09/10/24 NuStep lvl 6, 7 min Quat sets -- [employer] with SLR with heel propped in elevation  Bridges to tolerance AROM HS stretch with strap  Sidelying hip abduction/ extension  Scar mobilization   08/27/24 AROM into flexion/ Ext Quat sets -- Quad sets with SLR  Bridges to tolerance Scar mobs, effleurage NuStep lvl 1.   08/25/24 AAROM into flexion to tolerance Quad sets with towel roll under knee to tolerance  SLR with extension lag Supine abduction x5  Step up/down onto 6 step  Heel to toe gait around clinic with 1x axillary crutch Sit to stand from hi- low table with focus on keeping knee bent and foot underneath her.    08/19/24: Attempted PROM Glute sets  Discussion of protocol Ankle pumps as tolerated Re-iterated importance of knee extension and quad control Standing with bilat crutches with focus on WB as tolerated.       EVAL Changed bandages Ankle pumps in elevation Quad sets Seated heel  slide- instructed on lock/unlock mechanism to use restroom.  Gait with single crutch   PATIENT EDUCATION:  Education details: Anatomy of condition, POC, HEP, exercise form/rationale Person educated: Patient Education method: Explanation, Demonstration,  Tactile cues, Verbal cues, and Handouts Education comprehension: verbalized understanding, returned demonstration, verbal cues required, tactile cues required, and needs further education   HOME EXERCISE PROGRAM: Access Code: [email or web address] URL: [email or web address] Date: 08/01/2024 Prepared by: Harlene Cordon  Exercises - Ankle Pumps in Elevation  - 3-4 x daily - 7 x weekly - 3 sets - 10 reps - Supine Quad Set  - 3-4 x daily - 7 x weekly - 1 sets - 10 reps - 5s hold - Active Straight Leg Raise with Quad Set  - 3-4 x daily - 7 x weekly - 1 sets - 10 reps - Seated Heel Slide  - 3-4 x daily - 7 x weekly - 1 sets - 10 reps   ASSESSMENT:  CLINICAL IMPRESSION: Pt continues to be limited secondary to pain. Session with heavy focus on quad and glut strengthening to tolerance. She is now able to ambulate without a crutch, but has a defiant antalgic gait pattern. Pt will continue to benefit from skilled PT to address continued deficits.      REHAB POTENTIAL: Good  CLINICAL DECISION MAKING: Stable/uncomplicated  EVALUATION COMPLEXITY: Low   GOALS: Goals reviewed with patient? Yes   SHORT TERM GOALS: 10/08/2024  Ambulation with good quad set, without AD Baseline: Goal status: INITIAL   2.  ROM 0-90 without increased pain Baseline:  Goal status: INITIAL   3.  Independent with HEP as it has been established in the short term Baseline:  Goal status: INITIAL   4.  Compliance with RICE techniques to control edema Baseline:  Goal status: INITIAL    LONG TERM GOALS:  SLS on involved LE with good control for 20s Baseline: unable at eval Goal status: INITIAL   Target date: 09/26/2024  8 weeks  2.  No longer using AD for household and shorter community distances Baseline: requires AD at eval Goal status: INITIAL  Target date: 09/26/2024  8 weeks  3.  Demonstrate reciprocal stair navigation without compensation Baseline: unable at eval Goal status: INITIAL   Target date: 10/24/2024  12 weeks  4.  Lunges and squats with good control of varus valgus motion Baseline: unable at eval Goal status: INITIAL  Target date: 10/24/2024  12 weeks  5.  LE MMT via handheld dynamometry or Tindeq to 85% of uninvolved LE Baseline: not appropriate to test at eval Goal status: INITIAL  Target date: 11/21/24 16 weeks  6. Demo proper form with running Baseline: unable at eval Goal status: INITIAL  Target date: 11/21/24 6 months      PLAN:  PT FREQUENCY: 1-2x/week  PT DURATION: POC date  PLANNED INTERVENTIONS: 97164- PT Re-evaluation, 97750- Physical Performance Testing, 97110-Therapeutic exercises, 97530- Therapeutic activity, 97112- Neuromuscular re-education, 97535- Self Care, 02859- Manual therapy, [phone]- Gait training, [phone]- Aquatic Therapy, [phone] (1-2 muscles), 20561 (3+ muscles)- Dry Needling, Patient/Family education, Balance training, Stair training, Taping, Joint mobilization, Spinal mobilization, Scar mobilization, and Cryotherapy.  PLAN FOR NEXT SESSION: per MPFL protocol, requested that she bring shoes that hold her heel   Rojean JONELLE Batten, PT, DPT 09/10/2024, 11:01 AM  For all possible CPT codes, reference the Planned Interventions line above.     Check all conditions that are expected to impact treatment: {Conditions expected to impact treatment:Neurological condition and/or seizures   If treatment provided  at initial evaluation, no treatment charged due to lack of authorization.       "

## 2024-09-12 ENCOUNTER — Ambulatory Visit (HOSPITAL_BASED_OUTPATIENT_CLINIC_OR_DEPARTMENT_OTHER): Admitting: Orthopaedic Surgery

## 2024-09-12 ENCOUNTER — Other Ambulatory Visit (HOSPITAL_BASED_OUTPATIENT_CLINIC_OR_DEPARTMENT_OTHER): Payer: Self-pay

## 2024-09-12 DIAGNOSIS — M25361 Other instability, right knee: Secondary | ICD-10-CM

## 2024-09-12 MED ORDER — TRIAMCINOLONE ACETONIDE 40 MG/ML IJ SUSP
80.0000 mg | INTRAMUSCULAR | Status: AC | PRN
  Administered 2024-09-12: 80 mg via INTRA_ARTICULAR

## 2024-09-12 MED ORDER — LIDOCAINE HCL 1 % IJ SOLN
4.0000 mL | INTRAMUSCULAR | Status: AC | PRN
  Administered 2024-09-12: 4 mL

## 2024-09-12 MED ORDER — OXYCODONE HCL 5 MG PO TABS
5.0000 mg | ORAL_TABLET | ORAL | 0 refills | Status: AC | PRN
  Filled 2024-09-12: qty 15, 3d supply, fill #0

## 2024-09-12 NOTE — Progress Notes (Signed)
 "                                Post Operative Evaluation    Procedure/Date of Surgery: Right knee MPFL reconstruction 12/9  Interval History:   Since 6 weeks status post above procedure.  She is making significant improvement with regard to flexion of the knee.  She is still having some difficulty with extension.  Overall she is progressing well with therapy.  She is having significant medial sided pain   PMH/PSH/Family History/Social History/Meds/Allergies:    Past Medical History:  Diagnosis Date   Anemia    Arthritis    Cervical cancer (HCC)    Colitis    Collapsed lung    Crohn's disease (HCC)    Edema    Multiple sclerosis    Pelvic pain in female 09/30/2014   PTSD (post-traumatic stress disorder)    Rheumatoid arthritis (HCC)    Past Surgical History:  Procedure Laterality Date   ABDOMINAL SURGERY     CERVICAL CONE BIOPSY  2015   MEDIAL PATELLOFEMORAL LIGAMENT REPAIR Right 07/29/2024   Procedure: Right knee medial patellofemoral ligament reconstruction;  Surgeon: Genelle Standing, MD;  Location: [facility];  Service: Orthopedics;  Laterality: Right;   TUBAL LIGATION     Social History   Socioeconomic History   Marital status: Single    Spouse name: Not on file   Number of children: Not on file   Years of education: Not on file   Highest education level: 10th grade  Occupational History   Not on file  Tobacco Use   Smoking status: Some Days    Current packs/day: 0.25    Types: Cigarettes   Smokeless tobacco: Never  Vaping Use   Vaping status: Some Days   Substances: Nicotine  Substance and Sexual Activity   Alcohol use: Never   Drug use: Yes    Types: Marijuana    Comment: daily   Sexual activity: Yes    Partners: Male    Birth control/protection: Surgical  Other Topics Concern   Not on file  Social History Narrative   Right handed       Social Drivers of Health   Tobacco Use: High Risk (08/27/2024)    Patient History    Smoking Tobacco Use: Some Days    Smokeless Tobacco Use: Never    Passive Exposure: Not on file  Financial Resource Strain: High Risk (07/31/2024)   Overall Financial Resource Strain (CARDIA)    Difficulty of Paying Living Expenses: Hard  Food Insecurity: Food Insecurity Present (07/31/2024)   Epic    Worried About Programme Researcher, Broadcasting/film/video in the Last Year: Often true    Ran Out of Food in the Last Year: Often true  Transportation Needs: Unmet Transportation Needs (07/31/2024)   Epic    Lack of Transportation (Medical): Yes    Lack of Transportation (Non-Medical): Yes  Physical Activity: Insufficiently Active (07/31/2024)   Exercise Vital Sign    Days of Exercise per Week: 2 days    Minutes of Exercise per Session: 20 min  Stress: Stress Concern Present (07/31/2024)   [employer] of Occupational Health - Occupational Stress Questionnaire    Feeling of Stress: Rather much  Social Connections: Unknown (07/31/2024)   Social Connection and Isolation Panel    Frequency of Communication with Friends and Family: More than three times a week    Frequency of Social  Gatherings with Friends and Family: More than three times a week    Attends Religious Services: Never    Active Member of Clubs or Organizations: No    Attends Banker Meetings: Not on file    Marital Status: Patient declined  Depression (PHQ2-9): Medium Risk (02/18/2024)   Depression (PHQ2-9)    PHQ-2 Score: 9  Alcohol Screen: Low Risk (07/31/2024)   Alcohol Screen    Last Alcohol Screening Score (AUDIT): 0  Housing: High Risk (07/31/2024)   Epic    Unable to Pay for Housing in the Last Year: Patient declined    Number of Times Moved in the Last Year: Not on file    Homeless in the Last Year: Yes  Utilities: Not on file  Health Literacy: Not on file   Family History  Problem Relation Age of Onset   Migraines Mother    Migraines Maternal Aunt    Breast  cancer Maternal Aunt    Cervical cancer Maternal Aunt    Breast cancer Maternal Aunt    Migraines Maternal Grandmother    BRCA 1/2 Neg Hx    Allergies[1] Current Outpatient Medications  Medication Sig Dispense Refill   oxyCODONE  (ROXICODONE ) 5 MG immediate release tablet Take 1 tablet (5 mg total) by mouth every 4 (four) hours as needed. 15 tablet 0   aspirin  EC 325 MG tablet Take 1 tablet (325 mg total) by mouth daily. (Patient not taking: Reported on 07/22/2024) 14 tablet 0   Erenumab -aooe (AIMOVIG ) 140 MG/ML SOAJ Inject 140 mg into the skin every 28 (twenty-eight) days. (Patient not taking: Reported on 07/24/2024) 1.12 mL 5   HYDROmorphone  (DILAUDID ) 2 MG tablet Take 1 tablet (2 mg total) by mouth every 4 (four) hours as needed for severe pain (pain score 7-10). 20 tablet 0   hydrOXYzine  (ATARAX ) 25 MG tablet Take 1-2 tablets (25-50 mg total) by mouth 2 (two) times daily as needed for anxiety (or sleep). 60 tablet 2   mirtazapine  (REMERON ) 15 MG tablet Take 1 tablet (15 mg total) by mouth at bedtime. (Patient not taking: Reported on 07/22/2024) 30 tablet 2   Multiple Vitamins-Minerals (MULTIVITAMIN WITH MINERALS) tablet Take 1 tablet by mouth daily.     naproxen  (NAPROSYN ) 500 MG tablet TAKE 1 TABLET BY MOUTH THREE TIMES DAILY WITH MEALS 30 tablet 0   ondansetron  (ZOFRAN -ODT) 4 MG disintegrating tablet Take 1 tablet (4 mg total) by mouth every 8 (eight) hours as needed. 20 tablet 5   SUMAtriptan  (IMITREX ) 50 MG tablet Take 1 tablet (50 mg total) by mouth as needed for migraine. May repeat in 2 hours if headache persists or recurs.  Maximum 2 tablets in 24 hours. 10 tablet 5   No current facility-administered medications for this visit.   No results found.  Review of Systems:   A ROS was performed including pertinent positives and negatives as documented in the HPI.   Musculoskeletal Exam:    There were no vitals taken for this visit.  Right knee with well-appearing  incisions range of motion is from 2 degrees to 90 degrees without a block.  2 quadrants of lateral patellar motion  Imaging:      I personally reviewed and interpreted the radiographs.   Assessment:   6 weeks status post right knee MPFL reconstruction doing well.  I did recommend an ultrasound-guided injection of the right knee in order to improve pain and swelling.  She would like to consider this today.  I will plan  to see her back in 4 weeks for reassessment  Plan :    - Return to clinic 4 weeks for reassessment    Right knee injection provided after  Verbal consent obtained     Procedure Note  Patient: SHANTARA GOOSBY             Date of Birth: 1976-08-12           MRN: [id number]             Visit Date: 09/12/2024  Procedures: Visit Diagnoses:  1. Patellar instability of right knee     Large Joint Inj on 09/12/2024 1:07 PM Indications: pain Details: 22 G 1.5 in needle, ultrasound-guided anterior approach  Arthrogram: No  Medications: 4 mL lidocaine  1 %; 80 mg triamcinolone  acetonide 40 MG/ML Outcome: tolerated well, no immediate complications Procedure, treatment alternatives, risks and benefits explained, specific risks discussed. Consent was given by the patient. Immediately prior to procedure a time out was called to verify the correct patient, procedure, equipment, support staff and site/side marked as required. Patient was prepped and draped in the usual sterile fashion.        I personally saw and evaluated the patient, and participated in the management and treatment plan.  Elspeth Parker, MD Attending Physician, Orthopedic Surgery  This document was dictated using Dragon voice recognition software. A reasonable attempt at proof reading has been made to minimize errors.      [1] Allergies Allergen Reactions   Shellfish Allergy Swelling   Codeine Hives and Nausea And Vomiting   Hydrocodone  Hives   Latex     Instant rash, itching and  bladder infection   Oxycodone  Hives and Nausea And Vomiting  "

## 2024-09-13 ENCOUNTER — Ambulatory Visit (HOSPITAL_BASED_OUTPATIENT_CLINIC_OR_DEPARTMENT_OTHER): Admitting: Physical Therapy

## 2024-09-15 ENCOUNTER — Ambulatory Visit (HOSPITAL_BASED_OUTPATIENT_CLINIC_OR_DEPARTMENT_OTHER): Admitting: Physical Therapy

## 2024-09-16 ENCOUNTER — Other Ambulatory Visit (HOSPITAL_COMMUNITY): Payer: Self-pay

## 2024-09-17 ENCOUNTER — Ambulatory Visit (HOSPITAL_BASED_OUTPATIENT_CLINIC_OR_DEPARTMENT_OTHER): Admitting: Physical Therapy

## 2024-09-19 ENCOUNTER — Other Ambulatory Visit (HOSPITAL_COMMUNITY): Payer: Self-pay

## 2024-09-22 ENCOUNTER — Encounter (HOSPITAL_BASED_OUTPATIENT_CLINIC_OR_DEPARTMENT_OTHER): Payer: Self-pay | Admitting: Pharmacist

## 2024-09-22 ENCOUNTER — Other Ambulatory Visit (HOSPITAL_COMMUNITY): Payer: Self-pay

## 2024-09-22 ENCOUNTER — Ambulatory Visit (HOSPITAL_BASED_OUTPATIENT_CLINIC_OR_DEPARTMENT_OTHER): Admitting: Physical Therapy

## 2024-09-24 ENCOUNTER — Other Ambulatory Visit (HOSPITAL_COMMUNITY): Payer: Self-pay

## 2024-09-24 NOTE — Telephone Encounter (Signed)
 Per Medicaid, patient has a different primary insurance.  Corresponding with patient in effort to get this cleared up.

## 2024-09-25 ENCOUNTER — Encounter (HOSPITAL_BASED_OUTPATIENT_CLINIC_OR_DEPARTMENT_OTHER): Payer: Self-pay | Admitting: Physical Therapy

## 2024-09-25 ENCOUNTER — Ambulatory Visit (HOSPITAL_BASED_OUTPATIENT_CLINIC_OR_DEPARTMENT_OTHER): Admitting: Physical Therapy

## 2024-09-25 DIAGNOSIS — M6281 Muscle weakness (generalized): Secondary | ICD-10-CM

## 2024-09-25 DIAGNOSIS — M25561 Pain in right knee: Secondary | ICD-10-CM

## 2024-09-25 DIAGNOSIS — R262 Difficulty in walking, not elsewhere classified: Secondary | ICD-10-CM

## 2024-09-25 NOTE — Therapy (Signed)
 " OUTPATIENT PHYSICAL THERAPY TREATMENT   Patient Name: Gloria Ramos MRN: [id number] DOB:[birth year], 49 y.o., female Today's Date: 09/25/2024  END OF SESSION:  PT End of Session - 09/25/24 1203     Visit Number 6    Number of Visits 21    Date for Recertification  10/26/23    Authorization Type [place] [employer]    PT Start Time 1200    PT Stop Time 1245    PT Time Calculation (min) 45 min    Activity Tolerance Patient tolerated treatment well    Behavior During Therapy WFL for tasks assessed/performed              Past Medical History:  Diagnosis Date   Anemia    Arthritis    Cervical cancer (HCC)    Colitis    Collapsed lung    Crohn's disease (HCC)    Edema    Multiple sclerosis    Pelvic pain in female 09/30/2014   PTSD (post-traumatic stress disorder)    Rheumatoid arthritis (HCC)    Past Surgical History:  Procedure Laterality Date   ABDOMINAL SURGERY     CERVICAL CONE BIOPSY  2015   MEDIAL PATELLOFEMORAL LIGAMENT REPAIR Right 07/29/2024   Procedure: Right knee medial patellofemoral ligament reconstruction;  Surgeon: Genelle Standing, MD;  [facility];  Service: Orthopedics;  Laterality: Right;   TUBAL LIGATION     Patient Active Problem List   Diagnosis Date Noted   Patellar instability of right knee 07/29/2024   Perimenopausal vasomotor symptoms 02/13/2024   Panic attacks 02/13/2024   Use of cannabis 02/13/2024   Neuropathy 02/13/2024   PTSD (post-traumatic stress disorder) 02/13/2024   Irregular menses 01/29/2024   Crohn's disease (HCC) 01/29/2024   Multiple sclerosis 01/17/2024   Spontaneous pneumothorax 09/30/2014   Cervical dysplasia 09/30/2014   Decreased hearing 09/30/2014     REFERRING PROVIDER:  Genelle Standing, MD      REFERRING DIAG:  M25.361 (ICD-10-CM) - Patellar instability of right knee      S/p PROCEDURE: 1. Right knee medial patellofemoral ligament reconstruction  Rationale for Evaluation and Treatment:  Rehabilitation  THERAPY DIAG:  Difficulty in walking, not elsewhere classified  Muscle weakness (generalized)  Acute pain of right knee  ONSET DATE: DOS 12/9   SUBJECTIVE:                                                                                                                                                                                           SUBJECTIVE STATEMENT: It is getting there. As soon as the ice clears I will get back  to walking. I have been doing exercises.   PERTINENT HISTORY:  MS, PTSD  PAIN:  Are you having pain? Yes: NPRS scale: 2-3 Pain location: Rt knee Pain description: not that bad- most at medial knee but have been rubbing and doing scar mobs Aggravating factors: moving, constant Relieving factors: rest, ice, meds  PRECAUTIONS:  None  RED FLAGS: None   WEIGHT BEARING RESTRICTIONS:  WBAT  FALLS:  Has patient fallen in last 6 months? No  LIVING ENVIRONMENT: Has stairs in home, 4 steps to home with daughter  OCCUPATION:  Not working  PLOF:  Independent  PATIENT GOALS:  Back to running   OBJECTIVE:  Note: Objective measures were completed at Evaluation unless otherwise noted.   PATIENT SURVEYS:  LEFS  Extreme difficulty/unable (0), Quite a bit of difficulty (1), Moderate difficulty (2), Little difficulty (3), No difficulty (4) Survey date:  EVAL 12/12 2/5   Any of your usual work, housework or school activities  2  2. Usual hobbies, recreational or sporting activities  2  3. Getting into/out of the bath  2  4. Walking between rooms 1 3  5. Putting on socks/shoes  2  6. Squatting   1  7. Lifting an object, like a bag of groceries from the floor  4  8. Performing light activities around your home  2  9. Performing heavy activities around your home  1  10. Getting into/out of a car 1 3  11. Walking 2 blocks  2  12. Walking 1 mile  2  13. Going up/down 10 stairs (1 flight)  3  14. Standing for 1 hour  1  15.  sitting  for 1 hour 2 1  16. Running on even ground  1  17. Running on uneven ground  1  18. Making sharp turns while running fast  1  19. Hopping   3  20. Rolling over in bed 2 2  Score total:  6 39/80     COGNITIVE STATUS: Within functional limits for tasks assessed   SENSATION: Numbness reported in leg/foot  EDEMA:  Yes: notable edema through leg and foot which pt says is usual for her   GAIT: Comments: EVAL  arrived with single axillary crutch- educated on using opposite of surgical side.    Body Part #1 Knee  PALPATION: EVAL: non-pitting edema, no s/s of infection  LOWER EXTREMITY ROM:   Left AROM throughout is [facility]  Active  Right eval Rt  09/25/24  Knee flexion 30 130  Knee extension -5 0  Ankle dorsiflexion 0   Ankle plantarflexion    Ankle inversion    Ankle eversion     (Blank rows = not tested)   TREATMENT DATE:  09/25/24 Supine quad set to SLR Sit<>stand ball bw knees, no UE Patellar mobs Single leg modified dead lift Lateral step up 2- cues for glut set, full knee ext & controlled release Ktape for edema  09/10/24 NuStep lvl 6, 7 min Quat sets -- [employer] with SLR with heel propped in elevation  Bridges to tolerance AROM HS stretch with strap  Sidelying hip abduction/ extension  Scar mobilization   08/27/24 AROM into flexion/ Ext Quat sets -- Quad sets with SLR  Bridges to tolerance Scar mobs, effleurage NuStep lvl 1.   08/25/24 AAROM into flexion to tolerance Quad sets with towel roll under knee to tolerance  SLR with extension lag Supine abduction x5  Step up/down onto 6 step  Heel to  toe gait around clinic with 1x axillary crutch Sit to stand from hi- low table with focus on keeping knee bent and foot underneath her.    08/19/24: Attempted PROM Glute sets  Discussion of protocol Ankle pumps as tolerated Re-iterated importance of knee extension and quad control Standing with bilat crutches with focus on WB as tolerated.        EVAL Changed bandages Ankle pumps in elevation Quad sets Seated heel slide- instructed on lock/unlock mechanism to use restroom.  Gait with single crutch   PATIENT EDUCATION:  Education details: Anatomy of condition, POC, HEP, exercise form/rationale Person educated: Patient Education method: Explanation, Demonstration, Tactile cues, Verbal cues, and Handouts Education comprehension: verbalized understanding, returned demonstration, verbal cues required, tactile cues required, and needs further education   HOME EXERCISE PROGRAM: Access Code: [email or web address] URL: [email or web address] Date: 08/01/2024 Prepared by: Harlene Cordon    ASSESSMENT:  CLINICAL IMPRESSION: Pt is able to demo full ROM but is lacking control for end range extension in CKC. Continued high pain levels at medial knee and edema noted- placed ktape for attempt to control edema.      REHAB POTENTIAL: Good  CLINICAL DECISION MAKING: Stable/uncomplicated  EVALUATION COMPLEXITY: Low   GOALS: Goals reviewed with patient? Yes   SHORT TERM GOALS:   Ambulation with good quad set, without AD Baseline: Goal status: partially met- 5 deg quad lag in SLR   2.  ROM 0-90 without increased pain Baseline:  Goal status: MET   3.  Independent with HEP as it has been established in the short term Baseline:  Goal status: MET   4.  Compliance with RICE techniques to control edema Baseline:  Goal status: MET    LONG TERM GOALS:  SLS on involved LE with good control for 20s Baseline: unable at eval Goal status: INITIAL   Target date: 09/26/2024  8 weeks  2.  No longer using AD for household and shorter community distances Baseline: requires AD at eval Goal status: INITIAL  Target date: 09/26/2024  8 weeks  3.  Demonstrate reciprocal stair navigation without compensation Baseline: unable at eval Goal status: INITIAL  Target date: 10/24/2024  12 weeks  4.  Lunges and squats with good  control of varus valgus motion Baseline: unable at eval Goal status: INITIAL  Target date: 10/24/2024  12 weeks  5.  LE MMT via handheld dynamometry or Tindeq to 85% of uninvolved LE Baseline: not appropriate to test at eval Goal status: INITIAL  Target date: 11/21/24 16 weeks  6. Demo proper form with running Baseline: unable at eval Goal status: INITIAL  Target date: 11/21/24 6 months      PLAN:  PT FREQUENCY: 1-2x/week  PT DURATION: POC date  PLANNED INTERVENTIONS: 97164- PT Re-evaluation, 97750- Physical Performance Testing, 97110-Therapeutic exercises, 97530- Therapeutic activity, 97112- Neuromuscular re-education, 97535- Self Care, 02859- Manual therapy, [phone]- Gait training, [phone]- Aquatic Therapy, [phone] (1-2 muscles), [phone] (3+ muscles)- Dry Needling, Patient/Family education, Balance training, Stair training, Taping, Joint mobilization, Spinal mobilization, Scar mobilization, and Cryotherapy.  PLAN FOR NEXT SESSION: per MPFL protocol   Harlene Cordon, PT, DPT 09/25/2024, 3:51 PM  For all possible CPT codes, reference the Planned Interventions line above.     Check all conditions that are expected to impact treatment: {Conditions expected to impact treatment:Neurological condition and/or seizures   If treatment provided at initial evaluation, no treatment charged due to lack of authorization.       "

## 2024-09-26 ENCOUNTER — Ambulatory Visit: Admitting: Neurology

## 2024-09-26 DIAGNOSIS — R202 Paresthesia of skin: Secondary | ICD-10-CM

## 2024-09-26 NOTE — Procedures (Signed)
 " [facility] Neurology  [address], [address]  [place], [place] [address] Tel: [phone] Fax: [phone] Test Date:  09/26/2024  Patient: Bryanne Riquelme DOB: [birth year] Physician: Tonita Blanch, DO  Sex: Female Height: 5' 5 Ref Phys: Tonita Blanch, DO  ID#: [id number]   Technician:    History: This is a 49 year old female referred for evaluation of bilateral feet paresthesias and pain.  NCV & EMG Findings: Electrodiagnostic testing of the right lower extremity and additional studies of the left shows: Bilateral sural and superficial peroneal sensory responses are within normal limits. Bilateral peroneal and tibial motor responses are within normal limits. Bilateral tibial H reflex studies are within normal limits. There is no evidence of active or chronic motor axonal changes affecting any of the tested muscles.  Motor unit configuration and recruitment pattern is within normal limits.  Impression: This is a normal study of the lower extremities.  In particular, there is no evidence of a large fiber sensorimotor polyneuropathy or lumbosacral radiculopathy.    ___________________________ Tonita Blanch, DO    Nerve Conduction Studies   Stim Site NR Peak (ms) Norm Peak (ms) O-P Amp (V) Norm O-P Amp  Left Sup Peroneal Anti Sensory (Ant Lat Mall)  32 C  12 cm    2.5 <4.5 10.5 >5  Right Sup Peroneal Anti Sensory (Ant Lat Mall)  32 C  12 cm    2.4 <4.5 7.6 >5  Left Sural Anti Sensory (Lat Mall)  32 C  Calf    2.2 <4.5 14.3 >5  Right Sural Anti Sensory (Lat Mall)  32 C  Calf    2.6 <4.5 14.0 >5     Stim Site NR Onset (ms) Norm Onset (ms) O-P Amp (mV) Norm O-P Amp Site1 Site2 Delta-0 (ms) Dist (cm) Vel (m/s) Norm Vel (m/s)  Left Peroneal Motor (Ext Dig Brev)  32 C  Ankle    4.6 <5.5 4.0 >3 B Fib Ankle 6.9 36.0 52 >40  B Fib    11.5  3.3  Poplt B Fib 1.5 7.0 47 >40  Poplt    13.0  3.3         Right Peroneal Motor (Ext Dig Brev)  32 C  Ankle    3.2 <5.5 5.3 >3 B Fib  Ankle 6.5 35.0 54 >40  B Fib    9.7  4.7  Poplt B Fib 1.6 8.0 50 >40  Poplt    11.3  4.5         Left Tibial Motor (Abd Hall Brev)  32 C  Ankle    3.4 <6.0 10.2 >8 Knee Ankle 8.2 38.0 46 >40  Knee    11.6  8.7         Right Tibial Motor (Abd Hall Brev)  32 C  Ankle    3.4 <6.0 11.3 >8 Knee Ankle 7.6 39.0 51 >40  Knee    11.0  8.9          Electromyography   Side Muscle Ins.Act Fibs Fasc Recrt Amp Dur Poly Activation Comment  Right AntTibialis Nml Nml Nml Nml Nml Nml Nml Nml N/A  Right Gastroc Nml Nml Nml Nml Nml Nml Nml Nml N/A  Right Flex Dig Long Nml Nml Nml Nml Nml Nml Nml Nml N/A  Right RectFemoris Nml Nml Nml Nml Nml Nml Nml Nml N/A  Right GluteusMed Nml Nml Nml Nml Nml Nml Nml Nml N/A  Left AntTibialis Nml Nml Nml Nml Nml Nml Nml Nml N/A  Left Gastroc Nml Nml Nml Nml Nml Nml Nml Nml N/A  Left Flex Dig Long Nml Nml Nml Nml Nml Nml Nml Nml N/A  Left RectFemoris Nml Nml Nml Nml Nml Nml Nml Nml N/A  Left GluteusMed Nml Nml Nml Nml Nml Nml Nml Nml N/A      Waveforms:                         "

## 2024-09-29 ENCOUNTER — Ambulatory Visit (HOSPITAL_BASED_OUTPATIENT_CLINIC_OR_DEPARTMENT_OTHER): Admitting: Physical Therapy

## 2024-10-02 ENCOUNTER — Encounter (HOSPITAL_BASED_OUTPATIENT_CLINIC_OR_DEPARTMENT_OTHER): Admitting: Physical Therapy

## 2024-10-06 ENCOUNTER — Encounter (HOSPITAL_BASED_OUTPATIENT_CLINIC_OR_DEPARTMENT_OTHER): Admitting: Physical Therapy

## 2024-10-09 ENCOUNTER — Encounter (HOSPITAL_BASED_OUTPATIENT_CLINIC_OR_DEPARTMENT_OTHER): Admitting: Physical Therapy

## 2024-10-15 ENCOUNTER — Encounter (HOSPITAL_BASED_OUTPATIENT_CLINIC_OR_DEPARTMENT_OTHER): Admitting: Orthopaedic Surgery

## 2024-10-20 ENCOUNTER — Encounter (HOSPITAL_BASED_OUTPATIENT_CLINIC_OR_DEPARTMENT_OTHER): Admitting: Physical Therapy

## 2024-10-23 ENCOUNTER — Encounter (HOSPITAL_BASED_OUTPATIENT_CLINIC_OR_DEPARTMENT_OTHER): Admitting: Physical Therapy

## 2025-02-23 ENCOUNTER — Ambulatory Visit: Payer: Self-pay | Admitting: Neurology
